# Patient Record
Sex: Female | Born: 1950 | Race: Asian | Hispanic: No | Marital: Married | Smoking: Never smoker
Health system: Southern US, Community
[De-identification: ages and names within clinical notes are randomized; demographics above are authoritative.]

## PROBLEM LIST (undated history)

## (undated) DIAGNOSIS — I10 Essential (primary) hypertension: Secondary | ICD-10-CM

## (undated) DIAGNOSIS — E119 Type 2 diabetes mellitus without complications: Secondary | ICD-10-CM

## (undated) DIAGNOSIS — N289 Disorder of kidney and ureter, unspecified: Secondary | ICD-10-CM

---

## 2005-06-09 ENCOUNTER — Inpatient Hospital Stay (HOSPITAL_COMMUNITY): Admission: AD | Admit: 2005-06-09 | Discharge: 2005-06-10 | Payer: Self-pay | Admitting: Cardiovascular Disease

## 2014-08-05 ENCOUNTER — Encounter (HOSPITAL_COMMUNITY): Payer: Self-pay | Admitting: Neurology

## 2014-08-05 ENCOUNTER — Emergency Department (HOSPITAL_COMMUNITY)
Admission: EM | Admit: 2014-08-05 | Discharge: 2014-08-05 | Disposition: A | Discharge status: Home / Self Care | Payer: BLUE CROSS/BLUE SHIELD | Attending: Emergency Medicine | Admitting: Emergency Medicine

## 2014-08-05 DIAGNOSIS — Z992 Dependence on renal dialysis: Secondary | ICD-10-CM | POA: Insufficient documentation

## 2014-08-05 DIAGNOSIS — E119 Type 2 diabetes mellitus without complications: Secondary | ICD-10-CM | POA: Diagnosis not present

## 2014-08-05 DIAGNOSIS — N189 Chronic kidney disease, unspecified: Secondary | ICD-10-CM

## 2014-08-05 DIAGNOSIS — I120 Hypertensive chronic kidney disease with stage 5 chronic kidney disease or end stage renal disease: Secondary | ICD-10-CM | POA: Diagnosis not present

## 2014-08-05 DIAGNOSIS — N186 End stage renal disease: Secondary | ICD-10-CM | POA: Diagnosis not present

## 2014-08-05 HISTORY — DX: Essential (primary) hypertension: I10

## 2014-08-05 HISTORY — DX: Type 2 diabetes mellitus without complications: E11.9

## 2014-08-05 HISTORY — DX: Disorder of kidney and ureter, unspecified: N28.9

## 2014-08-05 LAB — I-STAT CHEM 8, ED
BUN: 43 mg/dL — ABNORMAL HIGH (ref 6–23)
CREATININE: 6.2 mg/dL — AB (ref 0.50–1.10)
Calcium, Ion: 1.06 mmol/L — ABNORMAL LOW (ref 1.13–1.30)
Chloride: 94 mmol/L — ABNORMAL LOW (ref 96–112)
Glucose, Bld: 398 mg/dL — ABNORMAL HIGH (ref 70–99)
HEMATOCRIT: 39 % (ref 36.0–46.0)
HEMOGLOBIN: 13.3 g/dL (ref 12.0–15.0)
Potassium: 4.5 mmol/L (ref 3.5–5.1)
Sodium: 129 mmol/L — ABNORMAL LOW (ref 135–145)
TCO2: 19 mmol/L (ref 0–100)

## 2014-08-05 NOTE — ED Notes (Signed)
Patient placed in the room, in gown and attached to 5 Lead cardiac monitor.

## 2014-08-05 NOTE — Discharge Instructions (Signed)
°Emergency Department Resource Guide °1) Find a Doctor and Pay Out of Pocket °Although you won't have to find out who is covered by your insurance plan, it is a good idea to ask around and get recommendations. You will then need to call the office and see if the doctor you have chosen will accept you as a new patient and what types of options they offer for patients who are self-pay. Some doctors offer discounts or will set up payment plans for their patients who do not have insurance, but you will need to ask so you aren't surprised when you get to your appointment. ° °2) Contact Your Local Health Department °Not all health departments have doctors that can see patients for sick visits, but many do, so it is worth a call to see if yours does. If you don't know where your local health department is, you can check in your phone book. The CDC also has a tool to help you locate your state's health department, and many state websites also have listings of all of their local health departments. ° °3) Find a Walk-in Clinic °If your illness is not likely to be very severe or complicated, you may want to try a walk in clinic. These are popping up all over the country in pharmacies, drugstores, and shopping centers. They're usually staffed by nurse practitioners or physician assistants that have been trained to treat common illnesses and complaints. They're usually fairly quick and inexpensive. However, if you have serious medical issues or chronic medical problems, these are probably not your best option. ° °No Primary Care Doctor: °- Call [facility] at  832-8000 - they can help you locate a primary care doctor that  accepts your insurance, provides certain services, etc. °- Physician Referral Service- 1-800-533-3463 ° °Chronic Pain Problems: °Organization         Address  Phone   Notes  °[facility]  (336) 297-2271 Patients need to be referred by their primary care doctor.  ° °Medication  Assistance: °Organization         Address  Phone   Notes  °[place] [facility] [address] °[place], [place] [address] (336) 641-8030 --Must be a resident of [place] °-- Must have NO insurance coverage whatsoever (no Medicaid/ Medicare, etc.) °-- The pt. MUST have a primary care doctor that directs their care regularly and follows them in the community °  °[facility]  (866) 331-1348   °[employer]  (888) 892-1162   ° °Agencies that provide inexpensive medical care: °Organization         Address  Phone   Notes  °[facility]  (336) 832-8035   °[facility]    (336) 832-7272   °[facility] [address] °[place], [place] [address] (336) 832-4777   °[facility] [address], °[place] (336) 271-4999   °[facility]    (336) 373-0678   °[facility]    (336) 272-1050   °[facility] ° [address], [place] Phone:  (336) 832-4444, Fax:  (336) 832-4440 Hours of Operation:  9 am - 6 pm, M-F.  Also accepts Medicaid/Medicare and self-pay.  °[facility] ° [address], [place] Phone: (336) 832-3150, Fax: (336) 832-3151. Hours of Operation:  8:30 am - 5:30 pm, M-F.  Also accepts Medicaid and self-pay.  °[facility] [place] [address], [place] Phone: (336) 878-6027   °[facility] [address], [place], [place] (336)723-1848, Ext. 123 Mondays & Thursdays: 7-9 AM.  First 15 patients are seen on a first come, first serve basis. °  ° °Medicaid-accepting [place] Providers: ° °Organization         Address  Phone   Notes  °[facility] [address], [place] (336) 641-2100 Also accepts self-pay patients.  °[facility] [address], [place] ° (336) 856-9996   °[facility] [address], [place]  (336) 288-8857   °[facility] [address] [place] [address], [place] (336) 299-7000   °Veita Bland [address], [place]  ° (336) 373-1557 Only accepts [place] Access Medicaid patients after they have their name applied to their card.  ° °Self-Pay (no insurance) in [place]: ° °Organization         Address  Phone   Notes  °Sickle Cell Patients, [facility] [address], [place] (336) 832-1970   °[facility] [address], [place] (336) 832-4400   °[facility] ° [address], [place] (336) 992-4800   °[facility]/Dr. Osei-Bonsu ° [address] [place] [address], [place] or [address], [place] (336) 841-8500 Phone number for both [place] and [place] locations is the same.  °[facility] [address], [place] (336) 299-0000   °[facility] [address] [place] [address], [place] or [address] (336) 852-7530 °(336) 878-2260   °[facility] [address], [place] (336) 350-1642, phone; (336) 294-5005, fax Sees patients 1st and 3rd Saturday of every month.  Must not qualify for public or private insurance (i.e. Medicaid, Medicare, [place] Health Choice, Veterans' Benefits) • Household income should be no more than 200% of the poverty level •The clinic cannot treat you if you are pregnant or think you are pregnant • Sexually transmitted diseases are not treated at the clinic.  ° ° °Dental Care: °Organization         Address  Phone  Notes  °[place] [facility] [address], [place] (336) 641-6152 Accepts children up to age 21 who are enrolled in Medicaid or [place] Health Choice; pregnant women with a Medicaid card; and children who have applied for Medicaid or [place] Health Choice, but were declined, whose parents can pay a reduced fee at time of service.  °[place]  [facility] [place]  [address], [place] (336) 641-7733 Accepts children up to age 21 who are enrolled in Medicaid or [place] Health Choice; pregnant women with a Medicaid card; and children who have applied for Medicaid or [place] Health Choice, but were declined, whose parents can pay a reduced fee at time of service.  °[facility] ° [address], [place] (336) 641-4533 Patients are seen by appointment only. Walk-ins are not accepted. [facility] will see patients 18 years of age and older. °Monday - Tuesday (8am-5pm) °Most Wednesdays (8:30-5pm) °$30 per visit, cash only  °[facility] ° [address], [place] (336) 641-4533 Patients are seen by appointment only. Walk-ins are not accepted. [facility] will see patients 18 years of age and older. °One   Wednesday Evening (Monthly: Volunteer Based).  $30 per visit, cash only  °[facility]  (919) 537-3737 for adults; Children under age 4, call Graduate Pediatric Dentistry at (919) [phone]. Children aged 4-14, please call (919) 537-3737 to request a pediatric application. ° Dental services are provided in all areas of dental care including fillings, crowns and bridges, complete and partial dentures, implants, gum treatment, root canals, and extractions. Preventive care is also provided. Treatment is provided to both adults and children. °Patients are selected via a lottery and there is often a waiting list. °  °[facility] [address], °[place] ° (336) 763-8833 [email or web address] °  °[facility] [address], [place], [place] (336)723-1848, Ext. 123 Second and Fourth Thursday of each month, opens at 6:30 AM; Clinic ends at 9 AM.  Patients are seen on a first-come first-served basis, and a limited number are seen during each clinic.  ° °[facility] ° [address], [place], [place] (336) 723-7904    Eligibility Requirements °You must have lived in [place], [place], or [place] counties for at least the last three months. °  You cannot be eligible for state or federal sponsored healthcare insurance, including Veterans Administration, Medicaid, or Medicare. °  You generally cannot be eligible for healthcare insurance through your employer.  °  How to apply: °Eligibility screenings are held every Tuesday and Wednesday afternoon from 1:00 pm until 4:00 pm. You do not need an appointment for the interview!  °[facility] [address], [place], [place] 336-631-2330   °[facility]  336-342-8273   °[facility]  336-703-3100   °[facility]  336-570-6415   ° °Behavioral Health Resources in the Community: °Intensive Outpatient Programs °Organization         Address  Phone  Notes  °[facility] [address], [place], [place] 336-878-6098   °[facility] [address], [place], [place] 336-832-9800   °[facility] [address], [place], [place] ° 336-882-2125   °[facility] [address],  °[place], [place] 1-800-853-5163 or 336-641-4981   °Substance Abuse Resources °Organization         Address  Phone  Notes  °[facility]  336-882-2125   °[facility]  336-784-9470   °[facility]  336-285-9073   °[facility]  336-845-3988   °Residential & Outpatient Substance Abuse Program  1-800-659-3381   °Psychological Services °Organization         Address  Phone  Notes  °[facility]  336- 832-9600   °[facility]  336- 378-7881   °[facility] [address], [place] 1-800-853-5163 or 336-641-4981   ° °Mobile Crisis Teams °Organization         Address  Phone  Notes  °[facility], Mobile Crisis Care Unit  1-877-626-1772   °Assertive °[facility] ° [address]  [place], [place] 336-834-9664   °Sharon DeEsch [address] °[place] 336-554-5454   ° °Self-Help/Support Groups °Organization         Address  Phone             Notes  °[facility] - variety of support groups  336- 373-1402 Call for more information  °Narcotics Anonymous (NA), [facility] [address], °[place]  2 meetings at this location  ° °  Residential Treatment Programs °Organization         Address  Phone  Notes  °[facility] [address],    °[place]  1-866-801-8205   °[facility] ° [address], [place], [place] 704-293-8524   °[facility] [address], [place] 336-845-3988 Admissions: 8am-3pm M-F  °[facility] [address]    °[place], [place] 336-841-1104   °[facility] [address], [place], [place] 336-379-7146   °[facility] [address]  °[place], [place] 336-285-9073   °[facility] - Intensive Outpatient [address], [place], [place] 336-852-3033   °[facility] [address]  °[place], [place] 1-877-615-2722 or 336-784-9470   °[facility] [address] [place], [place] 336-227-7417 Accepts Medicaid  °[facility] [address]  °[place] 1-800-659-3381 Substance Abuse/Addiction Treatment  ° °[place] Behavioral Health Resources °Organization         Address  Phone  Notes  °[facility]  (888) 581-9988   °Julie Brannon, PhD [address] [place], [place]   (336) 349-5553 or (336) 951-0000   °[facility]   [address] °[place], [place] (336) 349-4454   °[facility] [address], [place], [place] (336) 342-8316 Insurance/Medicaid/sponsorship through [facility]  °[facility] [address]                                    [place], [place] (336) 342-8316 Therapy/tele-psych/case    °[facility] [address]  ° [place], [place] (336) 349-2233    °Dr. Arfeen  (336) 349-4544   °[facility] [place]  [employer] [place] [facility] 1) [address], [place] °2) [address], [place] °3)  [address], [place] (336) 349-3220 °(336) 342-7768 ° °(336) 342-8140   °[place] [facility] (336) 342-1394 or (336) 342-3537 (After Hours)    ° ° °

## 2014-08-05 NOTE — ED Notes (Signed)
Dialysis on Monday, Wednesday and Fridays.  Patient moved her and has not been dialysised since Friday.

## 2014-08-05 NOTE — ED Provider Notes (Signed)
CSN: [id number]     Arrival date & time 08/05/14  1154 History   First MD Initiated Contact with Patient 08/05/14 1225     Chief Complaint  Patient presents with  . Vascular Access Problem     The history is provided by the patient and a relative.  Patient presents for dialysis. She goes to dialysis Monday/wednesday/friday.  She last had dialysis 3 days ago.  She denies cp/sob.  No fever is reported.  She has had mild cough due to allergies  [place] and has not had dialysis sessions arranged as of yet.  She is with her daughter in law Her course is stable.  Nothing worsens her symptoms.   Past Medical History  Diagnosis Date  . Diabetes mellitus without complication   . Renal disorder   . Hypertension    History reviewed. No pertinent past surgical history. No family history on file. History  Substance Use Topics  . Smoking status: Never Smoker   . Smokeless tobacco: Not on file  . Alcohol Use: No   OB History    No data available     Review of Systems  Constitutional: Negative for fever.  Respiratory: Negative for shortness of breath.       Allergies  Review of patient's allergies indicates no known allergies.  Home Medications   Prior to Admission medications   Not on File   BP 176/65 mmHg  Pulse 77  Temp(Src) 99 F (37.2 C) (Oral)  Resp 25  SpO2 99% Physical Exam CONSTITUTIONAL: Well developed/well nourished HEAD: Normocephalic/atraumatic EYES: EOMI NECK: supple no meningeal signs SPINE/BACK:entire spine nontender CV: S1/S2 noted, no murmurs/rubs/gallops noted LUNGS: scattered coarse BS, but no crackles noted ABDOMEN: soft, nontender, no rebound or guarding, bowel sounds noted throughout abdomen NEURO: Pt is awake/alert/appropriate, moves all extremitiesx4.  No facial droop.   EXTREMITIES: pulses normal/equal, full ROM, dialysis access to left UE with thrill noted. SKIN: warm, color normal PSYCH: no abnormalities of mood  noted, alert and oriented to situation  ED Course  Procedures   12:58 PM Consulted nephrology dr Kathrene [place] Will arrange outpatient dialysis Pt appropriate for d/c home  Labs Review Labs Reviewed  I-STAT CHEM 8, ED - Abnormal; Notable for the following:    Sodium 129 (*)    Chloride 94 (*)    BUN 43 (*)    Creatinine, Ser 6.20 (*)    Glucose, Bld 398 (*)    Calcium, Ion 1.06 (*)    All other components within normal limits     MDM   Final diagnoses:  Chronic renal failure, unspecified stage    Nursing notes including past medical history and social history reviewed and considered in documentation Labs/vital reviewed myself and considered during evaluation     Zadie Rhineonald Jerald Villalona, MD 08/05/14 1336

## 2014-08-05 NOTE — ED Notes (Signed)
Pt here requesting dialysis. [place]. Needs dialysis, was last dialyzed on Friday. Denies complaints. Is here with daughter in law.  No pain.

## 2014-08-12 ENCOUNTER — Other Ambulatory Visit: Payer: Self-pay

## 2014-08-16 ENCOUNTER — Other Ambulatory Visit: Payer: Self-pay | Admitting: Critical Care Medicine

## 2014-08-16 ENCOUNTER — Ambulatory Visit
Admission: RE | Admit: 2014-08-16 | Discharge: 2014-08-16 | Disposition: A | Discharge status: Home / Self Care | Payer: BLUE CROSS/BLUE SHIELD | Source: Ambulatory Visit | Attending: Critical Care Medicine | Admitting: Critical Care Medicine

## 2014-08-16 DIAGNOSIS — Z111 Encounter for screening for respiratory tuberculosis: Secondary | ICD-10-CM

## 2014-09-04 ENCOUNTER — Other Ambulatory Visit: Payer: Self-pay | Admitting: Nephrology

## 2014-09-04 ENCOUNTER — Ambulatory Visit
Admission: RE | Admit: 2014-09-04 | Discharge: 2014-09-04 | Disposition: A | Discharge status: Home / Self Care | Payer: BLUE CROSS/BLUE SHIELD | Source: Ambulatory Visit | Attending: Nephrology | Admitting: Nephrology

## 2014-09-04 DIAGNOSIS — R05 Cough: Secondary | ICD-10-CM

## 2014-09-04 DIAGNOSIS — R053 Chronic cough: Secondary | ICD-10-CM

## 2014-12-13 ENCOUNTER — Encounter: Payer: Self-pay | Admitting: Internal Medicine

## 2014-12-13 ENCOUNTER — Ambulatory Visit (INDEPENDENT_AMBULATORY_CARE_PROVIDER_SITE_OTHER): Payer: Medicare Other | Admitting: Internal Medicine

## 2014-12-13 ENCOUNTER — Other Ambulatory Visit (INDEPENDENT_AMBULATORY_CARE_PROVIDER_SITE_OTHER): Payer: Medicare Other | Admitting: *Deleted

## 2014-12-13 VITALS — BP 112/62 | HR 71 | Temp 98.3°F | Resp 12 | Ht 63.0 in | Wt 133.0 lb

## 2014-12-13 DIAGNOSIS — N186 End stage renal disease: Secondary | ICD-10-CM | POA: Diagnosis not present

## 2014-12-13 DIAGNOSIS — E1122 Type 2 diabetes mellitus with diabetic chronic kidney disease: Secondary | ICD-10-CM

## 2014-12-13 DIAGNOSIS — IMO0002 Reserved for concepts with insufficient information to code with codable children: Secondary | ICD-10-CM

## 2014-12-13 DIAGNOSIS — E1165 Type 2 diabetes mellitus with hyperglycemia: Principal | ICD-10-CM

## 2014-12-13 LAB — POCT GLYCOSYLATED HEMOGLOBIN (HGB A1C): Hemoglobin A1C: 13

## 2014-12-13 MED ORDER — INSULIN PEN NEEDLE 32G X 4 MM MISC: Status: AC

## 2014-12-13 MED ORDER — INSULIN GLARGINE 100 UNIT/ML SOLOSTAR PEN: 10.0000 [IU] | PEN_INJECTOR | Freq: Every day | SUBCUTANEOUS | Status: DC

## 2014-12-13 MED ORDER — GLIPIZIDE 10 MG PO TABS: 10.0000 mg | ORAL_TABLET | Freq: Two times a day (BID) | ORAL | Status: DC

## 2014-12-13 NOTE — Progress Notes (Signed)
Patient ID: Yolanda Rush, female   DOB: 09/28/1950, 64 y.o.   MRN: [id number]  HPI: Yolanda Rush is a 64 y.o.-year-old female, referred by her nephrologist, Dr.Deterding, for management of DM2, dx in 1983, uncontrolled, with complications (ESRD). She is here with her husband who offers most history and translating for [place]. She has M'care and M'aid.   Last hemoglobin A1c was: 08/22/2014: HbA1c 11.5%  Pt is on a regimen of: - Glipizide 10 mg bid - Onglyza 5 mg daily  Pt does not check her sugars (no meter), before, she was checking 1x a day. Reviewed log from dialysis center for the last 1.5 months (will scan) - am: 150-200 - 2h after b'fast: n/c - before lunch: n/c - 2h after lunch: n/c - before dinner: n/c - 2h after dinner: n/c - bedtime: n/c - nighttime: n/c No lows. Lowest sugar was 150s; she has hypoglycemia awareness at 70.  Highest sugar was 500s.  Glucometer: none  Pt's meals are - vegetarian: - Breakfast: cereals + milk - Lunch: [place] food - Dinner: [place] food - Snacks: once   - Has ESRD - on HD TTS, last BUN/creatinine:  Lab Results  Component Value Date   BUN 43* 08/05/2014   CREATININE 6.20* 08/05/2014   - last set of lipids: No results found for: CHOL, HDL, LDLCALC, LDLDIRECT, TRIG, CHOLHDL - last eye exam was in 2014 ([place]). ? If DR.  - no numbness and tingling in her feet.  Pt has FH of DM in mother.  ROS: Constitutional: no weight gain/loss, no fatigue, no subjective hyperthermia/hypothermia Eyes: no blurry vision, no xerophthalmia ENT: no sore throat, no nodules palpated in throat, no dysphagia/odynophagia, no hoarseness, + decreased hearing Cardiovascular: + occas.CP/no SOB/palpitations/leg swelling Respiratory: + cough - chronic/no SOB Gastrointestinal: no N/V/D/C Musculoskeletal: no muscle/+ joint aches Skin: no rashes, + easy bruising Neurological: no tremors/numbness/tingling/dizziness, + HAs Psychiatric: no depression/anxiety  Past  Medical History  Diagnosis Date  . Diabetes mellitus without complication   . Renal disorder   . Hypertension    No past surgical history on file.   History   Social History  . Marital Status: Married    Spouse Name: N/A  . Number of Children: 1   Occupational History  . retired   Social History Main Topics  . Smoking status: Never Smoker   . Smokeless tobacco: Not on file  . Alcohol Use: No  . Drug Use: Not on file   Current Outpatient Rx  Name  Route  Sig  Dispense  Refill  . Acetaminophen 500 MG TBDP   Oral   Take 1 tablet by mouth as needed. Equate Brand         . calcium acetate (PHOSLO) 667 MG capsule               . DiphenhydrAMINE HCl (ALLERGY MED PO)   Oral   Take 10 mg by mouth as needed. Equate Brand         . glipiZIDE (GLUCOTROL) 5 MG tablet               . metoprolol tartrate (LOPRESSOR) 25 MG tablet               . multivitamin (RENA-VIT) TABS tablet   Oral   Take 1 tablet by mouth daily.         . saxagliptin HCl (ONGLYZA) 5 MG TABS tablet   Oral   Take 5 mg by mouth daily.  No Known Allergies   FH: - noncontributory  PE: BP 112/62 mmHg  Pulse 71  Temp(Src) 98.3 F (36.8 C) (Oral)  Resp 12  Ht 5\' 3"  (1.6 m)  Wt 133 lb (60.328 kg)  BMI 23.57 kg/m2  SpO2 99% Wt Readings from Last 3 Encounters:  12/13/14 133 lb (60.328 kg)   Constitutional: overweight, in NAD, in wheelchair Eyes: PERRLA, EOMI, no exophthalmos ENT: moist mucous membranes, no thyromegaly, no cervical lymphadenopathy Cardiovascular: RRR, No MRG Respiratory: CTA B Gastrointestinal: abdomen soft, NT, ND, BS+ Musculoskeletal: no deformities, strength intact in all 4 Skin: moist, warm, no rashes, onycodystrophy L big toe Neurological: no tremor with outstretched hands, DTR normal in all 4  ASSESSMENT: 1. DM2, insulin-dependent, uncontrolled, with complications - ESRD  PLAN:  1. Patient with long-standing, uncontrolled diabetes, on oral  antidiabetic regimen, which became insufficient. Sugars widely fluctuating per records from nephrology - pt does not bring sugars, dose not check at home (no meter). Given meter, refilled strips. - will need insulin >> discussed basal insulin addition. Trained pt Re: correct adm. of insulin inj. - will start at low dose: 10 units hs >> may need more and may also need a different regimen in HD vs non-HD days; may also need mealtime insulin at next visit. For now, continue current po regimen. - I suggested to:  Patient Instructions  Please continue: - Glipizide 10 mg 2x a day, before meals - Onglyza 5 m daily in am  Please add: - Lantus 10 units at bedtime. If sugars in am not <150 in the next 3 days, please increase the dose to 12 units. If sugars in am not <150 in the next 3 days, please call us.  When injecting insulin:  Inject in the abdomen  Rotate the injection sites around the belly button  Change needle for each injection  Keep needle in for 10 sec after last unit of insulin in  Keep the insulin in use out of the fridge  Please return in 1 month with your sugar log.   - Strongly advised her to start checking sugars at different times of the day - check 3 times a day, rotating checks - given sugar log and advised how to fill it and to bring it at next appt  - given foot care handout and explained the principles  - given instructions for hypoglycemia management "15-15 rule"  - advised for yearly eye exams - check HbA1c today >> 13% (higher!) - Return to clinic in 1 mo with sugar log   - time spent with the patient: 1 hour, of which >50% was spent in obtaining information about her diabetes, reviewing her previous labs, evaluations, and treatments, counseling her and her husband about her condition (please see the discussed topics above), and developing a plan to further treat it.

## 2014-12-13 NOTE — Patient Instructions (Addendum)
Please continue: - Glipizide 10 mg 2x a day, before meals - Onglyza 5 m daily in am  Please add: - Lantus 10 units at bedtime. If sugars in am not <150 in the next 3 days, please increase the dose to 12 units. If sugars in am not <150 in the next 3 days, please call us.  When injecting insulin:  Inject in the abdomen  Rotate the injection sites around the belly button  Change needle for each injection  Keep needle in for 10 sec after last unit of insulin in  Keep the insulin in use out of the fridge  Please return in 1 month with your sugar log.   PATIENT INSTRUCTIONS FOR TYPE 2 DIABETES:  **Please join MyChart!** - see attached instructions about how to join if you have not done so already.  DIET AND EXERCISE Diet and exercise is an important part of diabetic treatment.  We recommended aerobic exercise in the form of brisk walking (working between 40-60% of maximal aerobic capacity, similar to brisk walking) for 150 minutes per week (such as 30 minutes five days per week) along with 3 times per week performing 'resistance' training (using various gauge rubber tubes with handles) 5-10 exercises involving the major muscle groups (upper body, lower body and core) performing 10-15 repetitions (or near fatigue) each exercise. Start at half the above goal but build slowly to reach the above goals. If limited by weight, joint pain, or disability, we recommend daily walking in a swimming pool with water up to waist to reduce pressure from joints while allow for adequate exercise.    BLOOD GLUCOSES Monitoring your blood glucoses is important for continued management of your diabetes. Please check your blood glucoses 2-4 times a day: fasting, before meals and at bedtime (you can rotate these measurements - e.g. one day check before the 3 meals, the next day check before 2 of the meals and before bedtime, etc.).   HYPOGLYCEMIA (low blood sugar) Hypoglycemia is usually a reaction to not  eating, exercising, or taking too much insulin/ other diabetes drugs.  Symptoms include tremors, sweating, hunger, confusion, headache, etc. Treat IMMEDIATELY with 15 grams of Carbs: . 4 glucose tablets .  cup regular juice/soda . 2 tablespoons raisins . 4 teaspoons sugar . 1 tablespoon honey Recheck blood glucose in 15 mins and repeat above if still symptomatic/blood glucose <100.  RECOMMENDATIONS TO REDUCE YOUR RISK OF DIABETIC COMPLICATIONS: * Take your prescribed MEDICATION(S) * Follow a DIABETIC diet: Complex carbs, fiber rich foods, (monounsaturated and polyunsaturated) fats * AVOID saturated/trans fats, high fat foods, >2,300 mg salt per day. * EXERCISE at least 5 times a week for 30 minutes or preferably daily.  * DO NOT SMOKE OR DRINK more than 1 drink a day. * Check your FEET every day. Do not wear tightfitting shoes. Contact us if you develop an ulcer * See your EYE doctor once a year or more if needed * Get a FLU shot once a year * Get a PNEUMONIA vaccine once before and once after age 59 years  GOALS:  * Your Hemoglobin A1c of <7%  * fasting sugars need to be <130 * after meals sugars need to be <180 (2h after you start eating) * Your Systolic BP should be 140 or lower  * Your Diastolic BP should be 80 or lower  * Your HDL (Good Cholesterol) should be 40 or higher  * Your LDL (Bad Cholesterol) should be 100 or lower. * Your Triglycerides  should be 150 or lower  * Your Urine microalbumin (kidney function) should be <30 * Your Body Mass Index should be 25 or lower

## 2014-12-19 ENCOUNTER — Telehealth: Payer: Self-pay | Admitting: Internal Medicine

## 2014-12-19 NOTE — Telephone Encounter (Signed)
Patient need refill of test strips don't know what kind of meter.

## 2014-12-23 ENCOUNTER — Telehealth: Payer: Self-pay | Admitting: Internal Medicine

## 2014-12-23 MED ORDER — ONETOUCH DELICA LANCETS FINE MISC: Status: AC

## 2014-12-23 MED ORDER — GLUCOSE BLOOD VI STRP: ORAL_STRIP | Status: AC

## 2014-12-23 NOTE — Telephone Encounter (Signed)
Pharmacist calling for new rx for the one touch ultra test strips and lancets please call them into [facility] pharm # [phone]  For the rx for test strips the ICD 10 code must be on it please and cannot exceed for than 5 refills

## 2014-12-30 ENCOUNTER — Other Ambulatory Visit: Payer: Self-pay | Admitting: *Deleted

## 2014-12-30 DIAGNOSIS — Z01810 Encounter for preprocedural cardiovascular examination: Secondary | ICD-10-CM

## 2014-12-30 DIAGNOSIS — N186 End stage renal disease: Secondary | ICD-10-CM

## 2015-01-07 ENCOUNTER — Encounter: Payer: Self-pay | Admitting: Vascular Surgery

## 2015-01-08 ENCOUNTER — Encounter: Payer: Self-pay | Admitting: Vascular Surgery

## 2015-01-08 ENCOUNTER — Ambulatory Visit (HOSPITAL_COMMUNITY)
Admission: RE | Admit: 2015-01-08 | Discharge: 2015-01-08 | Disposition: A | Discharge status: Home / Self Care | Payer: Medicare Other | Source: Ambulatory Visit | Attending: Vascular Surgery | Admitting: Vascular Surgery

## 2015-01-08 ENCOUNTER — Ambulatory Visit (INDEPENDENT_AMBULATORY_CARE_PROVIDER_SITE_OTHER): Payer: Medicare Other | Admitting: Vascular Surgery

## 2015-01-08 ENCOUNTER — Ambulatory Visit (INDEPENDENT_AMBULATORY_CARE_PROVIDER_SITE_OTHER)
Admission: RE | Admit: 2015-01-08 | Discharge: 2015-01-08 | Disposition: A | Discharge status: Home / Self Care | Payer: Medicare Other | Source: Ambulatory Visit | Attending: Vascular Surgery | Admitting: Vascular Surgery

## 2015-01-08 VITALS — BP 150/77 | HR 71 | Temp 98.0°F | Resp 16 | Ht 62.0 in | Wt 130.1 lb

## 2015-01-08 DIAGNOSIS — N186 End stage renal disease: Secondary | ICD-10-CM | POA: Insufficient documentation

## 2015-01-08 DIAGNOSIS — Z01810 Encounter for preprocedural cardiovascular examination: Secondary | ICD-10-CM | POA: Diagnosis present

## 2015-01-08 DIAGNOSIS — Z992 Dependence on renal dialysis: Secondary | ICD-10-CM | POA: Diagnosis not present

## 2015-01-08 NOTE — Progress Notes (Signed)
Referred by:  Zigmund Gottron, MD [address] [place], [place] [address]  Reason for referral: New access  History of Present Illness  Yolanda Rush is a 64 y.o. ([birth year]) female who presents for evaluation for permanent access.  The patient is right hand dominant.  Patient left upper arm access was created in [place] and lasted ~1.5 years.  She required multiple procedures over that time period.  She currently does HD via a LIJV TDC.  She is consider stopping HD.   Past Medical History  Diagnosis Date  . Diabetes mellitus without complication   . Renal disorder   . Hypertension     Past surgical history: R and L IJV TDC, LUA AVG   Social History   Social History  . Marital Status: Married    Spouse Name: N/A  . Number of Children: N/A  . Years of Education: N/A   Occupational History  . Not on file.   Social History Main Topics  . Smoking status: Never Smoker   . Smokeless tobacco: Never Used  . Alcohol Use: No  . Drug Use: No  . Sexual Activity: Not on file   Other Topics Concern  . Not on file   Social History Narrative    Family History  Problem Relation Age of Onset  . Diabetes Mother   . Hyperlipidemia Father     Current Outpatient Prescriptions on File Prior to Visit  Medication Sig Dispense Refill  . Acetaminophen 500 MG TBDP Take 1 tablet by mouth as needed. Equate Brand    . calcium acetate (PHOSLO) 667 MG capsule     . glipiZIDE (GLUCOTROL) 10 MG tablet Take 1 tablet (10 mg total) by mouth 2 (two) times daily before a meal. 60 tablet 3  . glucose blood (ONE TOUCH ULTRA TEST) test strip Use to test blood sugar 3 times daily as instructed. Dx: E11.22 100 each 3  . Insulin Glargine (LANTUS SOLOSTAR) 100 UNIT/ML Solostar Pen Inject 10 Units into the skin daily at 10 pm. 5 pen 2  . Insulin Pen Needle (CAREFINE PEN NEEDLES) 32G X 4 MM MISC Use 1x a dau 100 each 2  . metoprolol tartrate (LOPRESSOR) 25 MG tablet     . ONETOUCH DELICA  LANCETS FINE MISC Use to test blood sugar 3 times daily. Dx:E11.22 100 each 3  . saxagliptin HCl (ONGLYZA) 5 MG TABS tablet Take 5 mg by mouth daily.    . DiphenhydrAMINE HCl (ALLERGY MED PO) Take 10 mg by mouth as needed. Equate Brand    . multivitamin (RENA-VIT) TABS tablet Take 1 tablet by mouth daily.     No current facility-administered medications on file prior to visit.    No Known Allergies   REVIEW OF SYSTEMS:  (Positives checked otherwise negative)  CARDIOVASCULAR:  []  chest pain, []  chest pressure, []  palpitations, []  shortness of breath when laying flat, []  shortness of breath with exertion,  []  pain in feet when walking, []  pain in feet when laying flat, []  history of blood clot in veins (DVT), []  history of phlebitis, []  swelling in legs, []  varicose veins  PULMONARY:  []  productive cough, []  asthma, []  wheezing  NEUROLOGIC:  []  weakness in arms or legs, []  numbness in arms or legs, []  difficulty speaking or slurred speech, []  temporary loss of vision in one eye, []  dizziness  HEMATOLOGIC:  []  bleeding problems, []  problems with blood clotting too easily  MUSCULOSKEL:  []  joint pain, []  joint  swelling  GASTROINTEST:   vomiting blood,  blood in stool     GENITOURINARY:   burning with urination,  blood in urine  PSYCHIATRIC:   history of major depression  INTEGUMENTARY:   rashes,  ulcers  CONSTITUTIONAL:   fever,  chills  Physical Examination  Filed Vitals:   01/08/15 1028 01/08/15 1030  BP: 147/78 150/77  Pulse: 71 71  Temp: 98 F (36.7 C)   Resp: 16   Height:  (1.575 m)   Weight: 130 lb 1.1 oz (59 kg)   SpO2: 100%    Body mass index is 23.78 kg/(m^2).  General: A&O x 3, WD, thin  Head: [place]/AT, Temporalis wasting,   Ear/Nose/Throat: Hearing grossly intact, nares w/o erythema or drainage, oropharynx w/o Erythema/Exudate, Mallampati score: 3  Eyes: PERRLA, EOMI  Neck: Supple, no nuchal rigidity, no palpable LAD  Pulmonary: Sym  exp, good air movt, CTAB, no rales, rhonchi, & wheezing  Cardiac: RRR, Nl S1, S2, no Murmurs, rubs or gallops   Vascular: Vessel Right Left  Radial Palpable Palpable  Ulnar Faintly Palpable Faintly Palpable  Brachial Palpable Palpable   Musculoskeletal: M/S 5/5 throughout , Extremities without ischemic changes , LUA AVG thrombosed   Neurologic: Pain and light touch intact in arms, Motor exam as listed above   Non-Invasive Vascular Imaging  R Vein Mapping  (Date: 01/08/2015):   R arm: acceptable vein conduits include none  RUE Doppler (Date: 01/08/2015):   R arm:   Brachial: 3 mm, tri   Outside Studies/Documentation 2 pages of outside documents were reviewed including: outpatient nephrology procedure note .  Medical Decision Making  [name] is a 64 y.o. female who presents with ESRD requiring hemodialysis.    Patient is considering discontinuing HD.  Family is refusing any further access in this patient, and patient is in agreement.  Based on vein mapping and examination, this patient's permanent access options include: possible RUA AVG  Available as needed.  Obviously, if patient decides to discontinue HD, no further access will be needed.  Leonides Sake, MD [facility] Office: [phone] Pager: [phone]  01/08/2015, 10:56 AM

## 2015-01-09 ENCOUNTER — Encounter: Payer: Self-pay | Admitting: Nephrology

## 2015-02-03 ENCOUNTER — Encounter: Payer: Self-pay | Admitting: Internal Medicine

## 2015-02-03 ENCOUNTER — Ambulatory Visit (INDEPENDENT_AMBULATORY_CARE_PROVIDER_SITE_OTHER): Payer: Medicare Other | Admitting: Internal Medicine

## 2015-02-03 VITALS — BP 116/60 | HR 67 | Temp 97.9°F | Resp 12 | Wt 138.0 lb

## 2015-02-03 DIAGNOSIS — IMO0002 Reserved for concepts with insufficient information to code with codable children: Secondary | ICD-10-CM

## 2015-02-03 DIAGNOSIS — N186 End stage renal disease: Secondary | ICD-10-CM

## 2015-02-03 DIAGNOSIS — E1165 Type 2 diabetes mellitus with hyperglycemia: Principal | ICD-10-CM

## 2015-02-03 DIAGNOSIS — E1122 Type 2 diabetes mellitus with diabetic chronic kidney disease: Secondary | ICD-10-CM

## 2015-02-03 MED ORDER — "INSULIN SYRINGE-NEEDLE U-100 30G X 1/2"" 0.5 ML MISC": Status: AC

## 2015-02-03 MED ORDER — INSULIN GLARGINE 100 UNIT/ML SOLOSTAR PEN: 12.0000 [IU] | PEN_INJECTOR | Freq: Every day | SUBCUTANEOUS | Status: AC

## 2015-02-03 MED ORDER — INSULIN REGULAR HUMAN 100 UNIT/ML IJ SOLN: 4.0000 [IU] | Freq: Three times a day (TID) | INTRAMUSCULAR | Status: AC

## 2015-02-03 NOTE — Patient Instructions (Signed)
Continue Onglyza 5 mg in am. Please stop Glipizide.  Please decrease Lantus to 12 units at bedtime.  Start Regular insulin - 15 min before a meal: - 4 units before a smaller meal - 5 units before a larger meal  Please call and schedule an eye appt with Dr. Randon Goldsmith: [facility]:  Dr. Jeni Salles MD ?  Address: [address] [place], [place], [place] [address]  [phone]

## 2015-02-03 NOTE — Progress Notes (Signed)
Patient ID: Yolanda Rush, female   DOB: [birth year], 63 y.o.   MRN: [id number]  HPI: Yolanda Rush is a 64 y.o.-year-old female, initially referred by her nephrologist, Dr.Deterding, now returning for follow-up for DM2, dx in 1983, uncontrolled, with complications (ESRD). She is here with her husband who offers most history and translating for [place]. She has M'care and M'aid.   Reviewing her chart, she had an appointment with vascular surgery for permanent vascular access 1 month ago, at which she mentioned that she was considering stopping dialysis. Husbands denies this today.   Last hemoglobin A1c was: Lab Results  Component Value Date   HGBA1C 13.0 12/13/2014  08/22/2014: HbA1c 11.5%  Pt is on a regimen of: - Glipizide 10 mg bid - Onglyza 5 mg daily - Lantus 12-15 units at bedtime - started 12/2014  Pt is checking sugars once a day: - am: 77-120 - 2h after b'fast: 200-300s - before lunch:  - 2h after lunch: n/c - before dinner: n/c - 2h after dinner: 200-300 - bedtime: n/c - nighttime: n/c No lows. Lowest sugar was 150s >> 77; she has hypoglycemia awareness at 70.  Highest sugar was 500s >> 320  Glucometer: One Touch Verio flex  Pt's meals are - vegetarian - husband mentions she eats every hour, smaller meals, not larger meals. - Breakfast: cereals + milk - Lunch: [place] food - Dinner: [place] food - Snacks: once   - Has ESRD - on HD TTS. - last set of lipids: No results found for: CHOL, HDL, LDLCALC, LDLDIRECT, TRIG, CHOLHDL - last eye exam was in 2014 ([place]). ? If DR.  - no numbness and tingling in her feet.   ROS: Constitutional: no weight gain/loss, no fatigue, no subjective hyperthermia/hypothermia Eyes: no blurry vision, no xerophthalmia ENT: no sore throat, no nodules palpated in throat, no dysphagia/odynophagia, no hoarseness, + decreased hearing Cardiovascular: + occas.CP/no SOB/palpitations/leg swelling Respiratory: + cough - chronic/no  SOB Gastrointestinal: no N/V/D/C Musculoskeletal: no muscle/+ joint aches Skin: no rashes, + easy bruising Neurological: no tremors/numbness/tingling/dizziness, + HAs  I reviewed pt's medications, allergies, PMH, social hx, family hx, and changes were documented in the history of present illness. Otherwise, unchanged from my initial visit note.  Past Medical History  Diagnosis Date  . Diabetes mellitus without complication   . Renal disorder   . Hypertension    No past surgical history on file.   History   Social History  . Marital Status: Married    Spouse Name: N/A  . Number of Children: 1   Occupational History  . retired   Social History Main Topics  . Smoking status: Never Smoker   . Smokeless tobacco: Not on file  . Alcohol Use: No  . Drug Use: Not on file   Current Outpatient Rx  Name  Route  Sig  Dispense  Refill  . Acetaminophen 500 MG TBDP   Oral   Take 1 tablet by mouth as needed. Equate Brand         . calcium acetate (PHOSLO) 667 MG capsule               . DiphenhydrAMINE HCl (ALLERGY MED PO)   Oral   Take 10 mg by mouth as needed. Equate Brand         . glipiZIDE (GLUCOTROL) 5 MG tablet               . metoprolol tartrate (LOPRESSOR) 25 MG tablet               .  multivitamin (RENA-VIT) TABS tablet   Oral   Take 1 tablet by mouth daily.         . saxagliptin HCl (ONGLYZA) 5 MG TABS tablet   Oral   Take 5 mg by mouth daily.         No Known Allergies   FH: - noncontributory  PE: BP 116/60 mmHg  Pulse 67  Temp(Src) 97.9 F (36.6 C) (Oral)  Resp 12  Wt 38 lb 12.8 oz (17.6 kg)  SpO2 97%  Wt Readings from Last 3 Encounters:  02/03/15 38 lb 12.8 oz (17.6 kg)  01/08/15 130 lb 1.1 oz (59 kg)  12/13/14 133 lb (60.328 kg)   Constitutional: overweight, in NAD, in wheelchair Eyes: PERRLA, EOMI, no exophthalmos ENT: moist mucous membranes, no thyromegaly, no cervical lymphadenopathy Cardiovascular: RRR, No  MRG Respiratory: CTA B Gastrointestinal: abdomen soft, NT, ND, BS+ Musculoskeletal: no deformities, strength intact in all 4 Skin: moist, warm, no rashes, onycodystrophy L big toe Neurological: no tremor with outstretched hands, DTR normal in all 4  ASSESSMENT: 1. DM2, insulin-dependent, uncontrolled, with complications - ESRD  PLAN:  1. Patient with long-standing, uncontrolled diabetes, on oral antidiabetic regimen + basal insulin added at last visit. Sugars still very high throughout the day as she eats all the time per husband report. I suggested Regular insulin before main meals as this is longer acting c/w insulin analogs and covers her grazing. Will advise her to take the 15 min before a meal, rather than 30 min for this reason. - At last visit, we check her hemoglobin A1c and this is really high, at 13% - reviewed with the patient today. - Advised them to:  Patient Instructions  Continue Onglyza 5 mg in am. Please stop Glipizide.  Please decrease Lantus to 12 units at bedtime.  Start Regular insulin - 15 min before a meal: - 4 units before a smaller meal - 5 units before a larger meal  Please call and schedule an eye appt with Dr. Randon Goldsmith: [facility]:  Dr. Jeni Salles MD ?  Address: [address] [place], [place], [place] [address]  [phone]  - continue checking sugars at different times of the day - check 3 times a day, rotating checks - advised for yearly eye exams >> given Dr Randon Goldsmith tel nr. - Return to clinic in 1.5 mo with sugar log

## 2015-02-25 ENCOUNTER — Encounter: Payer: Self-pay | Admitting: Vascular Surgery

## 2015-02-28 ENCOUNTER — Ambulatory Visit: Payer: Medicare Other | Admitting: Vascular Surgery

## 2015-02-28 ENCOUNTER — Encounter: Payer: Self-pay | Admitting: Vascular Surgery

## 2015-02-28 ENCOUNTER — Encounter: Payer: Self-pay | Admitting: *Deleted

## 2015-02-28 ENCOUNTER — Other Ambulatory Visit: Payer: Self-pay | Admitting: *Deleted

## 2015-02-28 VITALS — BP 117/74 | HR 57 | Temp 98.3°F | Resp 14 | Ht 62.0 in | Wt 136.0 lb

## 2015-02-28 DIAGNOSIS — N186 End stage renal disease: Secondary | ICD-10-CM

## 2015-02-28 DIAGNOSIS — Z992 Dependence on renal dialysis: Principal | ICD-10-CM

## 2015-02-28 NOTE — Progress Notes (Signed)
Established Dialysis Access  History of Present Illness  Yolanda Rush is a 64 y.o. ([birth year]) female who presents for re-evaluation for permanent access.  The patient is right hand dominant.  Previous access procedures have been completed in the left arm.  The patient's complication from previous access procedures include: thrombosis.  The patient has never had a previous PPM placed.  Previously the patient was going to stop HD.  She has changed her mind so would like access placement.  The patient's PMH, PSH, SH, and FamHx are unchanged from 01/08/15.  Current Outpatient Prescriptions  Medication Sig Dispense Refill  . Acetaminophen 500 MG TBDP Take 1 tablet by mouth as needed. Equate Brand    . calcium acetate (PHOSLO) 667 MG capsule     . DiphenhydrAMINE HCl (ALLERGY MED PO) Take 10 mg by mouth as needed. Equate Brand    . glucose blood (ONE TOUCH ULTRA TEST) test strip Use to test blood sugar 3 times daily as instructed. Dx: E11.22 100 each 3  . HYDROcodone-acetaminophen (NORCO/VICODIN) 5-325 MG per tablet Take by mouth.    . lanthanum (FOSRENOL) 1000 MG chewable tablet Chew 1,000 mg by mouth 3 (three) times daily with meals.    . metoprolol tartrate (LOPRESSOR) 25 MG tablet     . multivitamin (RENA-VIT) TABS tablet Take 1 tablet by mouth daily.    Letta Pate. ONETOUCH DELICA LANCETS FINE MISC Use to test blood sugar 3 times daily. Dx:E11.22 100 each 3  . saxagliptin HCl (ONGLYZA) 5 MG TABS tablet Take 5 mg by mouth daily.    . Insulin Glargine (LANTUS SOLOSTAR) 100 UNIT/ML Solostar Pen Inject 12 Units into the skin daily at 10 pm. (Patient not taking: Reported on 02/28/2015) 5 pen 2  . Insulin Pen Needle (CAREFINE PEN NEEDLES) 32G X 4 MM MISC Use 1x a dau (Patient not taking: Reported on 02/28/2015) 100 each 2  . insulin regular (HUMULIN R) 100 units/mL injection Inject 0.04-0.05 mLs (4-5 Units total) into the skin 3 (three) times daily before meals. ReliOn insulin (Patient not taking:  Reported on 02/28/2015) 10 mL 2  . Insulin Syringe-Needle U-100 (B-D INS SYRINGE 0.5CC/30GX1/2") 30G X 1/2" 0.5 ML MISC Use 3x a day (Patient not taking: Reported on 02/28/2015) 100 each 11   No current facility-administered medications for this visit.    No Known Allergies  On ROS today: HD via LIJV HD, no significant changes in health since last appointment   Physical Examination  Filed Vitals:   02/28/15 0950  BP: 117/74  Pulse: 57  Temp: 98.3 F (36.8 C)  TempSrc: Oral  Resp: 14  Height: 5\' 2"  (1.575 m)  Weight: 136 lb (61.689 kg)  SpO2: 100%   Body mass index is 24.87 kg/(m^2).  General: A&O x 3, WD, thin  Pulmonary: Sym exp, good air movt, CTAB, no rales, rhonchi, & wheezing  Cardiac: RRR, Nl S1, S2, no Murmurs, rubs or gallops  Vascular: Vessel Right Left  Radial Palpable Palpable  Ulnar Faintly Palpable Faintly Palpable  Brachial Palpable Palpable   Gastrointestinal: soft, NTND, no G/R, bo HSM, no masses, no CVAT B  Musculoskeletal: M/S 5/5 throughout , Extremities without  ischemic changes , no palpable thrill in access in LUA AVG, no bruit in access  Neurologic: Pain and light touch intact in extremities , Motor exam as listed above    Medical Decision Making  Yolanda Rush is a 64 y.o. female who presents with ESRD requiring hemodialysis.    This  patient's veins are extremely small.  I would proceed with B vein mapping to see what her options for access are.  Risk to this procedure include anaphlyactic reaction to contrast.  The patient has agreed to proceed with the above procedure which will be scheduled 7 NOV 16.   Leonides Sake, MD [facility] Office: [phone] Pager: [phone]  02/28/2015, 10:14 AM

## 2015-03-11 ENCOUNTER — Encounter (HOSPITAL_COMMUNITY): Admission: EM | Disposition: E | Payer: Medicare Other | Source: Home / Self Care | Attending: Pulmonary Disease

## 2015-03-11 ENCOUNTER — Inpatient Hospital Stay (HOSPITAL_COMMUNITY): Payer: Medicare Other

## 2015-03-11 ENCOUNTER — Inpatient Hospital Stay (HOSPITAL_COMMUNITY)
Admission: EM | Admit: 2015-03-11 | Discharge: 2015-04-10 | DRG: 246 | Disposition: E | Payer: Medicare Other | Attending: Pulmonary Disease | Admitting: Pulmonary Disease

## 2015-03-11 ENCOUNTER — Encounter (HOSPITAL_COMMUNITY): Payer: Self-pay | Admitting: *Deleted

## 2015-03-11 ENCOUNTER — Encounter (HOSPITAL_COMMUNITY): Admission: EM | Disposition: E | Payer: Self-pay | Source: Home / Self Care | Attending: Pulmonary Disease

## 2015-03-11 ENCOUNTER — Emergency Department (HOSPITAL_COMMUNITY): Payer: Medicare Other

## 2015-03-11 DIAGNOSIS — R001 Bradycardia, unspecified: Secondary | ICD-10-CM | POA: Diagnosis present

## 2015-03-11 DIAGNOSIS — I120 Hypertensive chronic kidney disease with stage 5 chronic kidney disease or end stage renal disease: Secondary | ICD-10-CM | POA: Diagnosis present

## 2015-03-11 DIAGNOSIS — I2510 Atherosclerotic heart disease of native coronary artery without angina pectoris: Secondary | ICD-10-CM | POA: Diagnosis present

## 2015-03-11 DIAGNOSIS — E11641 Type 2 diabetes mellitus with hypoglycemia with coma: Secondary | ICD-10-CM | POA: Diagnosis present

## 2015-03-11 DIAGNOSIS — E162 Hypoglycemia, unspecified: Secondary | ICD-10-CM

## 2015-03-11 DIAGNOSIS — J690 Pneumonitis due to inhalation of food and vomit: Secondary | ICD-10-CM | POA: Diagnosis present

## 2015-03-11 DIAGNOSIS — E876 Hypokalemia: Secondary | ICD-10-CM | POA: Diagnosis present

## 2015-03-11 DIAGNOSIS — R079 Chest pain, unspecified: Secondary | ICD-10-CM | POA: Diagnosis present

## 2015-03-11 DIAGNOSIS — G9340 Encephalopathy, unspecified: Secondary | ICD-10-CM

## 2015-03-11 DIAGNOSIS — E872 Acidosis: Secondary | ICD-10-CM | POA: Diagnosis present

## 2015-03-11 DIAGNOSIS — E1165 Type 2 diabetes mellitus with hyperglycemia: Secondary | ICD-10-CM | POA: Diagnosis present

## 2015-03-11 DIAGNOSIS — E875 Hyperkalemia: Secondary | ICD-10-CM | POA: Diagnosis present

## 2015-03-11 DIAGNOSIS — I2119 ST elevation (STEMI) myocardial infarction involving other coronary artery of inferior wall: Secondary | ICD-10-CM | POA: Diagnosis not present

## 2015-03-11 DIAGNOSIS — G9341 Metabolic encephalopathy: Secondary | ICD-10-CM | POA: Diagnosis present

## 2015-03-11 DIAGNOSIS — J9600 Acute respiratory failure, unspecified whether with hypoxia or hypercapnia: Secondary | ICD-10-CM

## 2015-03-11 DIAGNOSIS — J9601 Acute respiratory failure with hypoxia: Secondary | ICD-10-CM | POA: Diagnosis present

## 2015-03-11 DIAGNOSIS — Z992 Dependence on renal dialysis: Secondary | ICD-10-CM

## 2015-03-11 DIAGNOSIS — Z7982 Long term (current) use of aspirin: Secondary | ICD-10-CM | POA: Diagnosis not present

## 2015-03-11 DIAGNOSIS — K7200 Acute and subacute hepatic failure without coma: Secondary | ICD-10-CM | POA: Diagnosis not present

## 2015-03-11 DIAGNOSIS — E871 Hypo-osmolality and hyponatremia: Secondary | ICD-10-CM | POA: Diagnosis present

## 2015-03-11 DIAGNOSIS — E1122 Type 2 diabetes mellitus with diabetic chronic kidney disease: Secondary | ICD-10-CM | POA: Diagnosis present

## 2015-03-11 DIAGNOSIS — N186 End stage renal disease: Secondary | ICD-10-CM | POA: Diagnosis not present

## 2015-03-11 DIAGNOSIS — E785 Hyperlipidemia, unspecified: Secondary | ICD-10-CM | POA: Diagnosis present

## 2015-03-11 DIAGNOSIS — D638 Anemia in other chronic diseases classified elsewhere: Secondary | ICD-10-CM | POA: Diagnosis present

## 2015-03-11 DIAGNOSIS — R570 Cardiogenic shock: Secondary | ICD-10-CM | POA: Diagnosis not present

## 2015-03-11 DIAGNOSIS — I2111 ST elevation (STEMI) myocardial infarction involving right coronary artery: Secondary | ICD-10-CM | POA: Diagnosis present

## 2015-03-11 DIAGNOSIS — K922 Gastrointestinal hemorrhage, unspecified: Secondary | ICD-10-CM | POA: Diagnosis present

## 2015-03-11 DIAGNOSIS — Z794 Long term (current) use of insulin: Secondary | ICD-10-CM

## 2015-03-11 DIAGNOSIS — I213 ST elevation (STEMI) myocardial infarction of unspecified site: Secondary | ICD-10-CM

## 2015-03-11 DIAGNOSIS — Z66 Do not resuscitate: Secondary | ICD-10-CM | POA: Diagnosis present

## 2015-03-11 HISTORY — PX: CARDIAC CATHETERIZATION: SHX172

## 2015-03-11 LAB — I-STAT CHEM 8, ED
BUN: 57 mg/dL — AB (ref 6–20)
BUN: 58 mg/dL — AB (ref 6–20)
CALCIUM ION: 1.06 mmol/L — AB (ref 1.13–1.30)
CALCIUM ION: 1.09 mmol/L — AB (ref 1.13–1.30)
CHLORIDE: 100 mmol/L — AB (ref 101–111)
CHLORIDE: 100 mmol/L — AB (ref 101–111)
CREATININE: 6.9 mg/dL — AB (ref 0.44–1.00)
CREATININE: 7.2 mg/dL — AB (ref 0.44–1.00)
GLUCOSE: 141 mg/dL — AB (ref 65–99)
GLUCOSE: 96 mg/dL (ref 65–99)
HCT: 41 % (ref 36.0–46.0)
HEMATOCRIT: 40 % (ref 36.0–46.0)
Hemoglobin: 13.6 g/dL (ref 12.0–15.0)
Hemoglobin: 13.9 g/dL (ref 12.0–15.0)
POTASSIUM: 7 mmol/L — AB (ref 3.5–5.1)
Potassium: 7.4 mmol/L (ref 3.5–5.1)
SODIUM: 130 mmol/L — AB (ref 135–145)
Sodium: 129 mmol/L — ABNORMAL LOW (ref 135–145)
TCO2: 10 mmol/L (ref 0–100)
TCO2: 11 mmol/L (ref 0–100)

## 2015-03-11 LAB — BASIC METABOLIC PANEL
ANION GAP: 22 — AB (ref 5–15)
BUN: 29 mg/dL — ABNORMAL HIGH (ref 6–20)
CALCIUM: 9.7 mg/dL (ref 8.9–10.3)
CO2: 22 mmol/L (ref 22–32)
CREATININE: 3.79 mg/dL — AB (ref 0.44–1.00)
Chloride: 92 mmol/L — ABNORMAL LOW (ref 101–111)
GFR calc non Af Amer: 12 mL/min — ABNORMAL LOW (ref >60)
GFR, EST AFRICAN AMERICAN: 13 mL/min — AB (ref >60)
Glucose, Bld: 220 mg/dL — ABNORMAL HIGH (ref 65–99)
Potassium: 4.6 mmol/L (ref 3.5–5.1)
Sodium: 136 mmol/L (ref 135–145)

## 2015-03-11 LAB — GLUCOSE, CAPILLARY
GLUCOSE-CAPILLARY: 136 mg/dL — AB (ref 65–99)
GLUCOSE-CAPILLARY: 116 mg/dL — AB (ref 65–99)
Glucose-Capillary: 24 mg/dL — CL (ref 65–99)
Glucose-Capillary: 73 mg/dL (ref 65–99)
Glucose-Capillary: 28 mg/dL — CL (ref 65–99)

## 2015-03-11 LAB — CBC WITH DIFFERENTIAL/PLATELET
BASOS ABS: 0 10*3/uL (ref 0.0–0.1)
BASOS PCT: 0 %
EOS ABS: 0 10*3/uL (ref 0.0–0.7)
Eosinophils Relative: 0 %
HEMATOCRIT: 37.5 % (ref 36.0–46.0)
HEMOGLOBIN: 12 g/dL (ref 12.0–15.0)
LYMPHS PCT: 6 %
Lymphs Abs: 1.9 10*3/uL (ref 0.7–4.0)
MCH: 28.6 pg (ref 26.0–34.0)
MCHC: 32 g/dL (ref 30.0–36.0)
MCV: 89.3 fL (ref 78.0–100.0)
MONOS PCT: 8 %
Monocytes Absolute: 2.5 10*3/uL — ABNORMAL HIGH (ref 0.1–1.0)
NEUTROS ABS: 27.4 10*3/uL — AB (ref 1.7–7.7)
Neutrophils Relative %: 86 %
Platelets: 80 10*3/uL — ABNORMAL LOW (ref 150–400)
RBC: 4.2 MIL/uL (ref 3.87–5.11)
RDW: 14.9 % (ref 11.5–15.5)
WBC: 31.8 10*3/uL — AB (ref 4.0–10.5)

## 2015-03-11 LAB — BRAIN NATRIURETIC PEPTIDE: B Natriuretic Peptide: >4500 pg/mL — ABNORMAL HIGH (ref 0.0–100.0)

## 2015-03-11 LAB — COMPREHENSIVE METABOLIC PANEL
ALBUMIN: 2.9 g/dL — AB (ref 3.5–5.0)
ALK PHOS: 85 U/L (ref 38–126)
ALT: 320 U/L — AB (ref 14–54)
AST: 716 U/L — ABNORMAL HIGH (ref 15–41)
Anion gap: 31 — ABNORMAL HIGH (ref 5–15)
BILIRUBIN TOTAL: 1.7 mg/dL — AB (ref 0.3–1.2)
BUN: 59 mg/dL — AB (ref 6–20)
CALCIUM: 9.9 mg/dL (ref 8.9–10.3)
CO2: 10 mmol/L — AB (ref 22–32)
CREATININE: 7.27 mg/dL — AB (ref 0.44–1.00)
Chloride: 93 mmol/L — ABNORMAL LOW (ref 101–111)
GFR calc Af Amer: 6 mL/min — ABNORMAL LOW (ref >60)
GFR calc non Af Amer: 5 mL/min — ABNORMAL LOW (ref >60)
GLUCOSE: 148 mg/dL — AB (ref 65–99)
Potassium: >7.5 mmol/L (ref 3.5–5.1)
SODIUM: 134 mmol/L — AB (ref 135–145)
TOTAL PROTEIN: 6.9 g/dL (ref 6.5–8.1)

## 2015-03-11 LAB — CBC
HCT: 30.9 % — ABNORMAL LOW (ref 36.0–46.0)
HEMATOCRIT: 38.8 % (ref 36.0–46.0)
HEMOGLOBIN: 12.9 g/dL (ref 12.0–15.0)
HEMOGLOBIN: 10 g/dL — AB (ref 12.0–15.0)
MCH: 28.7 pg (ref 26.0–34.0)
MCH: 28 pg (ref 26.0–34.0)
MCHC: 33.2 g/dL (ref 30.0–36.0)
MCHC: 32.4 g/dL (ref 30.0–36.0)
MCV: 86.2 fL (ref 78.0–100.0)
MCV: 86.6 fL (ref 78.0–100.0)
PLATELETS: 91 10*3/uL — AB (ref 150–400)
Platelets: 81 10*3/uL — ABNORMAL LOW (ref 150–400)
RBC: 4.5 MIL/uL (ref 3.87–5.11)
RBC: 3.57 MIL/uL — AB (ref 3.87–5.11)
RDW: 14.8 % (ref 11.5–15.5)
RDW: 14.7 % (ref 11.5–15.5)
WBC: 22.5 10*3/uL — ABNORMAL HIGH (ref 4.0–10.5)
WBC: 14.9 10*3/uL — AB (ref 4.0–10.5)

## 2015-03-11 LAB — POCT I-STAT, CHEM 8
BUN: 56 mg/dL — AB (ref 6–20)
BUN: 52 mg/dL — AB (ref 6–20)
CHLORIDE: 97 mmol/L — AB (ref 101–111)
CREATININE: 6.4 mg/dL — AB (ref 0.44–1.00)
Calcium, Ion: 1.31 mmol/L — ABNORMAL HIGH (ref 1.13–1.30)
Calcium, Ion: 1.18 mmol/L (ref 1.13–1.30)
Chloride: 98 mmol/L — ABNORMAL LOW (ref 101–111)
Creatinine, Ser: 6.3 mg/dL — ABNORMAL HIGH (ref 0.44–1.00)
GLUCOSE: 414 mg/dL — AB (ref 65–99)
Glucose, Bld: 499 mg/dL — ABNORMAL HIGH (ref 65–99)
HCT: 34 % — ABNORMAL LOW (ref 36.0–46.0)
HEMATOCRIT: 35 % — AB (ref 36.0–46.0)
Hemoglobin: 11.9 g/dL — ABNORMAL LOW (ref 12.0–15.0)
Hemoglobin: 11.6 g/dL — ABNORMAL LOW (ref 12.0–15.0)
POTASSIUM: 6.5 mmol/L — AB (ref 3.5–5.1)
Potassium: 5.9 mmol/L — ABNORMAL HIGH (ref 3.5–5.1)
SODIUM: 129 mmol/L — AB (ref 135–145)
Sodium: 128 mmol/L — ABNORMAL LOW (ref 135–145)
TCO2: 12 mmol/L (ref 0–100)
TCO2: 15 mmol/L (ref 0–100)

## 2015-03-11 LAB — BLOOD GAS, ARTERIAL
ACID-BASE DEFICIT: 2.2 mmol/L — AB (ref 0.0–2.0)
Bicarbonate: 19.9 mEq/L — ABNORMAL LOW (ref 20.0–24.0)
DRAWN BY: 441371
FIO2: 0.8
LHR: 18 {breaths}/min
MECHVT: 400 mL
O2 SAT: 99.7 %
PEEP/CPAP: 5 cmH2O
PH ART: 7.592 — AB (ref 7.350–7.450)
PO2 ART: 346 mmHg — AB (ref 80.0–100.0)
Patient temperature: 94.7
TCO2: 20.6 mmol/L (ref 0–100)
pCO2 arterial: 20.1 mmHg — ABNORMAL LOW (ref 35.0–45.0)

## 2015-03-11 LAB — POCT I-STAT 3, ART BLOOD GAS (G3+)
ACID-BASE DEFICIT: 12 mmol/L — AB (ref 0.0–2.0)
Acid-base deficit: 15 mmol/L — ABNORMAL HIGH (ref 0.0–2.0)
BICARBONATE: 10.8 meq/L — AB (ref 20.0–24.0)
BICARBONATE: 14.4 meq/L — AB (ref 20.0–24.0)
O2 SAT: 100 %
O2 Saturation: 100 %
PCO2 ART: 26.8 mmHg — AB (ref 35.0–45.0)
PCO2 ART: 35.1 mmHg (ref 35.0–45.0)
PH ART: 7.213 — AB (ref 7.350–7.450)
PO2 ART: 569 mmHg — AB (ref 80.0–100.0)
PO2 ART: 608 mmHg — AB (ref 80.0–100.0)
TCO2: 12 mmol/L (ref 0–100)
TCO2: 15 mmol/L (ref 0–100)
pH, Arterial: 7.221 — ABNORMAL LOW (ref 7.350–7.450)

## 2015-03-11 LAB — POCT ACTIVATED CLOTTING TIME
ACTIVATED CLOTTING TIME: 202 s
ACTIVATED CLOTTING TIME: 177 s
Activated Clotting Time: 245 seconds

## 2015-03-11 LAB — TROPONIN I

## 2015-03-11 LAB — I-STAT TROPONIN, ED

## 2015-03-11 LAB — MRSA PCR SCREENING: MRSA by PCR: NEGATIVE

## 2015-03-11 SURGERY — CORONARY ANGIOGRAM

## 2015-03-11 SURGERY — LEFT HEART CATH AND CORONARY ANGIOGRAPHY

## 2015-03-11 MED ORDER — INSULIN ASPART 100 UNIT/ML ~~LOC~~ SOLN: 2.0000 [IU] | SUBCUTANEOUS | Status: DC

## 2015-03-11 MED ORDER — DEXTROSE-NACL 5-0.45 % IV SOLN
INTRAVENOUS | Status: DC
  Administered 2015-03-11 – 2015-03-13 (×4): via INTRAVENOUS

## 2015-03-11 MED ORDER — LIDOCAINE HCL (PF) 1 % IJ SOLN: 5.0000 mL | INTRAMUSCULAR | Status: DC | PRN

## 2015-03-11 MED ORDER — HEPARIN (PORCINE) IN NACL 100-0.45 UNIT/ML-% IJ SOLN
INTRAMUSCULAR | Status: AC
  Filled 2015-03-11: qty 250

## 2015-03-11 MED ORDER — DEXTROSE 50 % IV SOLN: 1.0000 | Freq: Once | INTRAVENOUS | Status: AC

## 2015-03-11 MED ORDER — INSULIN ASPART 100 UNIT/ML ~~LOC~~ SOLN: 0.0000 [IU] | Freq: Three times a day (TID) | SUBCUTANEOUS | Status: DC

## 2015-03-11 MED ORDER — DEXTROSE 50 % IV SOLN
1.0000 | Freq: Once | INTRAVENOUS | Status: AC
  Administered 2015-03-11: 50 mL via INTRAVENOUS
  Filled 2015-03-11: qty 50

## 2015-03-11 MED ORDER — TICAGRELOR 90 MG PO TABS
90.0000 mg | ORAL_TABLET | Freq: Two times a day (BID) | ORAL | Status: DC
  Administered 2015-03-12 (×2): 90 mg via ORAL
  Filled 2015-03-11 (×2): qty 1

## 2015-03-11 MED ORDER — HEPARIN (PORCINE) IN NACL 100-0.45 UNIT/ML-% IJ SOLN
700.0000 [IU]/h | INTRAMUSCULAR | Status: DC
  Administered 2015-03-11: 700 [IU]/h via INTRAVENOUS

## 2015-03-11 MED ORDER — LIDOCAINE HCL (PF) 1 % IJ SOLN
INTRAMUSCULAR | Status: DC | PRN
  Administered 2015-03-11: 11:00:00

## 2015-03-11 MED ORDER — SODIUM CHLORIDE 0.9 % IJ SOLN: 3.0000 mL | INTRAMUSCULAR | Status: DC | PRN

## 2015-03-11 MED ORDER — BIVALIRUDIN BOLUS VIA INFUSION - CUPID
INTRAVENOUS | Status: DC | PRN
  Administered 2015-03-11 (×2): 46.275 mg via INTRAVENOUS

## 2015-03-11 MED ORDER — TICAGRELOR 90 MG PO TABS
180.0000 mg | ORAL_TABLET | Freq: Once | ORAL | Status: AC
  Administered 2015-03-11: 180 mg
  Filled 2015-03-11 (×2): qty 2

## 2015-03-11 MED ORDER — NOREPINEPHRINE BITARTRATE 1 MG/ML IV SOLN
0.0000 ug/min | INTRAVENOUS | Status: DC
  Administered 2015-03-11: 40 ug/min via INTRAVENOUS
  Filled 2015-03-11: qty 4

## 2015-03-11 MED ORDER — NITROGLYCERIN 1 MG/10 ML FOR IR/CATH LAB
INTRA_ARTERIAL | Status: AC
Start: 2015-03-11 — End: 2015-03-11
  Filled 2015-03-11: qty 10

## 2015-03-11 MED ORDER — TIROFIBAN HCL IN NACL 5-0.9 MG/100ML-% IV SOLN
INTRAVENOUS | Status: AC
  Filled 2015-03-11: qty 100

## 2015-03-11 MED ORDER — METOPROLOL TARTRATE 12.5 MG HALF TABLET: 12.5000 mg | ORAL_TABLET | Freq: Two times a day (BID) | ORAL | Status: DC

## 2015-03-11 MED ORDER — ETOMIDATE 2 MG/ML IV SOLN
INTRAVENOUS | Status: AC
  Filled 2015-03-11: qty 20

## 2015-03-11 MED ORDER — ASPIRIN 300 MG RE SUPP
600.0000 mg | Freq: Once | RECTAL | Status: AC
  Administered 2015-03-11: 600 mg via RECTAL
  Filled 2015-03-11: qty 2

## 2015-03-11 MED ORDER — SODIUM BICARBONATE 8.4 % IV SOLN
INTRAVENOUS | Status: DC | PRN
  Administered 2015-03-11 (×2): 50 meq via INTRAVENOUS

## 2015-03-11 MED ORDER — ONDANSETRON HCL 4 MG/2ML IJ SOLN: 4.0000 mg | Freq: Four times a day (QID) | INTRAMUSCULAR | Status: DC | PRN

## 2015-03-11 MED ORDER — CALCIUM CHLORIDE 10 % IV SOLN
INTRAVENOUS | Status: AC
  Filled 2015-03-11: qty 10

## 2015-03-11 MED ORDER — LIDOCAINE HCL (PF) 1 % IJ SOLN
INTRAMUSCULAR | Status: DC | PRN
  Administered 2015-03-11: 10 mL

## 2015-03-11 MED ORDER — HEPARIN SODIUM (PORCINE) 1000 UNIT/ML DIALYSIS: 1000.0000 [IU] | INTRAMUSCULAR | Status: DC | PRN

## 2015-03-11 MED ORDER — HEPARIN BOLUS VIA INFUSION
4000.0000 [IU] | Freq: Once | INTRAVENOUS | Status: AC
  Administered 2015-03-11: 4000 [IU] via INTRAVENOUS
  Filled 2015-03-11: qty 4000

## 2015-03-11 MED ORDER — SODIUM BICARBONATE 8.4 % IV SOLN: 50.0000 meq | Freq: Once | INTRAVENOUS | Status: DC

## 2015-03-11 MED ORDER — HEPARIN (PORCINE) IN NACL 2-0.9 UNIT/ML-% IJ SOLN
INTRAMUSCULAR | Status: AC
  Filled 2015-03-11: qty 1000

## 2015-03-11 MED ORDER — SUCCINYLCHOLINE CHLORIDE 20 MG/ML IJ SOLN
INTRAMUSCULAR | Status: AC
  Filled 2015-03-11: qty 1

## 2015-03-11 MED ORDER — DOPAMINE-DEXTROSE 3.2-5 MG/ML-% IV SOLN
0.0000 ug/kg/min | INTRAVENOUS | Status: DC
  Administered 2015-03-11: 20 ug/kg/min via INTRAVENOUS

## 2015-03-11 MED ORDER — ATORVASTATIN CALCIUM 40 MG PO TABS
40.0000 mg | ORAL_TABLET | Freq: Every day | ORAL | Status: DC
  Administered 2015-03-11 – 2015-03-12 (×2): 40 mg via ORAL
  Filled 2015-03-11 (×2): qty 1

## 2015-03-11 MED ORDER — IOHEXOL 350 MG/ML SOLN
INTRAVENOUS | Status: DC | PRN
  Administered 2015-03-11: 30 mL via INTRA_ARTERIAL

## 2015-03-11 MED ORDER — NITROGLYCERIN 0.4 MG SL SUBL: 0.4000 mg | SUBLINGUAL_TABLET | SUBLINGUAL | Status: DC | PRN

## 2015-03-11 MED ORDER — CHLORHEXIDINE GLUCONATE 0.12% ORAL RINSE (MEDLINE KIT)
15.0000 mL | Freq: Two times a day (BID) | OROMUCOSAL | Status: DC
  Administered 2015-03-11 – 2015-03-13 (×4): 15 mL via OROMUCOSAL

## 2015-03-11 MED ORDER — SODIUM CHLORIDE 0.9 % IV BOLUS (SEPSIS)
250.0000 mL | Freq: Once | INTRAVENOUS | Status: AC
  Administered 2015-03-11: 250 mL via INTRAVENOUS

## 2015-03-11 MED ORDER — SODIUM CHLORIDE 0.9 % IJ SOLN
3.0000 mL | Freq: Two times a day (BID) | INTRAMUSCULAR | Status: DC
  Administered 2015-03-11 – 2015-03-12 (×2): 3 mL via INTRAVENOUS

## 2015-03-11 MED ORDER — HEPARIN SODIUM (PORCINE) 5000 UNIT/ML IJ SOLN: 60.0000 [IU]/kg | Freq: Once | INTRAMUSCULAR | Status: DC

## 2015-03-11 MED ORDER — SODIUM BICARBONATE 8.4 % IV SOLN
INTRAVENOUS | Status: AC
  Filled 2015-03-11: qty 50

## 2015-03-11 MED ORDER — SODIUM CHLORIDE 0.9 % IV BOLUS (SEPSIS): 500.0000 mL | Freq: Once | INTRAVENOUS | Status: DC

## 2015-03-11 MED ORDER — ANTISEPTIC ORAL RINSE SOLUTION (CORINZ)
7.0000 mL | Freq: Four times a day (QID) | OROMUCOSAL | Status: DC
  Administered 2015-03-12 – 2015-03-13 (×6): 7 mL via OROMUCOSAL

## 2015-03-11 MED ORDER — DEXTROSE 50 % IV SOLN
INTRAVENOUS | Status: DC | PRN
  Administered 2015-03-11 (×2): 1 via INTRAVENOUS

## 2015-03-11 MED ORDER — TIROFIBAN (AGGRASTAT) BOLUS VIA INFUSION
INTRAVENOUS | Status: DC | PRN
  Administered 2015-03-11: 1542.5 ug via INTRAVENOUS

## 2015-03-11 MED ORDER — SODIUM CHLORIDE 0.9 % IV SOLN
250.0000 mL | INTRAVENOUS | Status: DC | PRN
Start: 2015-03-11 — End: 2015-03-13

## 2015-03-11 MED ORDER — SODIUM CHLORIDE 0.9 % IV SOLN
8.0000 mg/h | INTRAVENOUS | Status: DC
  Administered 2015-03-11: 8 mg/h via INTRAVENOUS
  Filled 2015-03-11 (×4): qty 80

## 2015-03-11 MED ORDER — SODIUM BICARBONATE 8.4 % IV SOLN
INTRAVENOUS | Status: DC | PRN
  Administered 2015-03-11: 50 meq via INTRAVENOUS

## 2015-03-11 MED ORDER — ROCURONIUM BROMIDE 50 MG/5ML IV SOLN
INTRAVENOUS | Status: DC | PRN
  Administered 2015-03-11: 100 mg via INTRAVENOUS

## 2015-03-11 MED ORDER — LIDOCAINE HCL (PF) 1 % IJ SOLN
INTRAMUSCULAR | Status: AC
  Filled 2015-03-11: qty 30

## 2015-03-11 MED ORDER — ALTEPLASE 2 MG IJ SOLR
2.0000 mg | Freq: Once | INTRAMUSCULAR | Status: DC | PRN
  Filled 2015-03-11: qty 2

## 2015-03-11 MED ORDER — BIVALIRUDIN 250 MG IV SOLR
250.0000 mg | INTRAVENOUS | Status: DC | PRN
  Administered 2015-03-11: 0.25 mg/kg/h via INTRAVENOUS

## 2015-03-11 MED ORDER — RENA-VITE PO TABS
1.0000 | ORAL_TABLET | Freq: Every day | ORAL | Status: DC
  Administered 2015-03-12: 1 via ORAL
  Filled 2015-03-11: qty 1

## 2015-03-11 MED ORDER — SODIUM CHLORIDE 0.9 % IV SOLN: 250.0000 mL | INTRAVENOUS | Status: DC | PRN

## 2015-03-11 MED ORDER — CALCIUM CHLORIDE 10 % IV SOLN
INTRAVENOUS | Status: DC | PRN
  Administered 2015-03-11: 1 g via INTRAVENOUS

## 2015-03-11 MED ORDER — SODIUM CHLORIDE 0.9 % IV SOLN: 100.0000 mL | INTRAVENOUS | Status: DC | PRN

## 2015-03-11 MED ORDER — ASPIRIN 81 MG PO CHEW: 324.0000 mg | CHEWABLE_TABLET | Freq: Once | ORAL | Status: DC

## 2015-03-11 MED ORDER — ETOMIDATE 2 MG/ML IV SOLN
INTRAVENOUS | Status: DC | PRN
  Administered 2015-03-11: 20 mg via INTRAVENOUS

## 2015-03-11 MED ORDER — LIDOCAINE HCL (CARDIAC) 20 MG/ML IV SOLN
INTRAVENOUS | Status: AC
  Filled 2015-03-11: qty 5

## 2015-03-11 MED ORDER — SODIUM CHLORIDE 0.9 % IV SOLN
80.0000 mg | Freq: Once | INTRAVENOUS | Status: AC
  Administered 2015-03-11: 80 mg via INTRAVENOUS
  Filled 2015-03-11: qty 80

## 2015-03-11 MED ORDER — DEXTROSE 5 % IV SOLN
Freq: Once | INTRAVENOUS | Status: AC
  Administered 2015-03-11: 09:00:00 via INTRAVENOUS

## 2015-03-11 MED ORDER — TIROFIBAN HCL IV 12.5 MG/250 ML: 0.0750 ug/kg/min | INTRAVENOUS | Status: AC

## 2015-03-11 MED ORDER — LIDOCAINE-PRILOCAINE 2.5-2.5 % EX CREA
1.0000 "application " | TOPICAL_CREAM | CUTANEOUS | Status: DC | PRN
  Filled 2015-03-11: qty 5

## 2015-03-11 MED ORDER — HEPARIN (PORCINE) IN NACL 2-0.9 UNIT/ML-% IJ SOLN
INTRAMUSCULAR | Status: AC
  Filled 2015-03-11: qty 1500

## 2015-03-11 MED ORDER — BIVALIRUDIN 250 MG IV SOLR
INTRAVENOUS | Status: AC
  Filled 2015-03-11: qty 250

## 2015-03-11 MED ORDER — TIROFIBAN HCL IN NACL 5-0.9 MG/100ML-% IV SOLN
INTRAVENOUS | Status: DC | PRN
  Administered 2015-03-11: 0.075 ug/kg/min via INTRAVENOUS

## 2015-03-11 MED ORDER — ASPIRIN EC 81 MG PO TBEC
81.0000 mg | DELAYED_RELEASE_TABLET | Freq: Every day | ORAL | Status: DC
  Administered 2015-03-12: 81 mg via ORAL
  Filled 2015-03-11: qty 1

## 2015-03-11 MED ORDER — HEPARIN BOLUS VIA INFUSION: 4000.0000 [IU] | Freq: Once | INTRAVENOUS | Status: DC

## 2015-03-11 MED ORDER — PANTOPRAZOLE SODIUM 40 MG IV SOLR: 40.0000 mg | Freq: Two times a day (BID) | INTRAVENOUS | Status: DC

## 2015-03-11 MED ORDER — ROCURONIUM BROMIDE 50 MG/5ML IV SOLN
INTRAVENOUS | Status: AC
  Filled 2015-03-11: qty 2

## 2015-03-11 MED ORDER — HEPARIN (PORCINE) IN NACL 100-0.45 UNIT/ML-% IJ SOLN
800.0000 [IU]/h | INTRAMUSCULAR | Status: DC
  Administered 2015-03-11: 800 [IU]/h via INTRAVENOUS
  Filled 2015-03-11: qty 250

## 2015-03-11 MED ORDER — FENTANYL CITRATE (PF) 100 MCG/2ML IJ SOLN: 25.0000 ug | INTRAMUSCULAR | Status: DC | PRN

## 2015-03-11 MED ORDER — TIROFIBAN HCL IV 12.5 MG/250 ML: 0.0750 ug/kg/min | INTRAVENOUS | Status: DC

## 2015-03-11 MED ORDER — ACETAMINOPHEN 325 MG PO TABS
650.0000 mg | ORAL_TABLET | ORAL | Status: DC | PRN
  Administered 2015-03-12: 650 mg via ORAL
  Filled 2015-03-11: qty 2

## 2015-03-11 MED ORDER — INSULIN ASPART 100 UNIT/ML ~~LOC~~ SOLN: 4.0000 [IU] | Freq: Three times a day (TID) | SUBCUTANEOUS | Status: DC

## 2015-03-11 MED ORDER — PENTAFLUOROPROP-TETRAFLUOROETH EX AERO: 1.0000 "application " | INHALATION_SPRAY | CUTANEOUS | Status: DC | PRN

## 2015-03-11 MED ORDER — DEXTROSE 50 % IV SOLN
INTRAVENOUS | Status: AC
  Filled 2015-03-11: qty 100

## 2015-03-11 MED ORDER — CALCIUM CHLORIDE 10 % IV SOLN: 1.0000 g | Freq: Once | INTRAVENOUS | Status: DC

## 2015-03-11 MED ORDER — NITROGLYCERIN IN D5W 200-5 MCG/ML-% IV SOLN
0.0000 ug/min | INTRAVENOUS | Status: DC
  Administered 2015-03-11: 5 ug/min via INTRAVENOUS

## 2015-03-11 MED ORDER — ALBUTEROL SULFATE (2.5 MG/3ML) 0.083% IN NEBU: 2.5000 mg | INHALATION_SOLUTION | RESPIRATORY_TRACT | Status: DC | PRN

## 2015-03-11 MED ORDER — VANCOMYCIN HCL IN DEXTROSE 1-5 GM/200ML-% IV SOLN
1000.0000 mg | INTRAVENOUS | Status: AC
  Administered 2015-03-12: 1000 mg via INTRAVENOUS
  Filled 2015-03-11: qty 200

## 2015-03-11 MED ORDER — NITROGLYCERIN IN D5W 200-5 MCG/ML-% IV SOLN
INTRAVENOUS | Status: AC
  Filled 2015-03-11: qty 250

## 2015-03-11 MED FILL — Medication: Qty: 1 | Status: AC

## 2015-03-11 SURGICAL SUPPLY — 28 items
BALLN EMERGE MR 2.0X15 (BALLOONS) ×2
BALLN EUPHORA RX 2.0X15 (BALLOONS) ×2
BALLN ~~LOC~~ EMERGE MR 3.0X15 (BALLOONS) ×2
BALLN ~~LOC~~ EMERGE MR 3.25X20 (BALLOONS) ×2
BALLN ~~LOC~~ EMERGE MR 3.5X20 (BALLOONS) ×2
BALLOON EMERGE MR 2.0X15 (BALLOONS) ×1 IMPLANT
BALLOON EUPHORA RX 2.0X15 (BALLOONS) ×1 IMPLANT
BALLOON ~~LOC~~ EMERGE MR 3.0X15 (BALLOONS) ×1 IMPLANT
BALLOON ~~LOC~~ EMERGE MR 3.25X20 (BALLOONS) ×1 IMPLANT
BALLOON ~~LOC~~ EMERGE MR 3.5X20 (BALLOONS) ×1 IMPLANT
CATH INFINITI 5 FR AR2 MOD (CATHETERS) ×2 IMPLANT
CATH INFINITI 5FR AL1 (CATHETERS) ×2 IMPLANT
CATH INFINITI 5FR MULTPACK ANG (CATHETERS) IMPLANT
CATH SITESEER 5F NTR (CATHETERS) ×2 IMPLANT
GUIDE CATH RUNWAY 6FR HS (CATHETERS) ×2 IMPLANT
GUIDE CATH RUNWAY 6FR RC3.5 ~~LOC~~ (CATHETERS) ×2 IMPLANT
GUIDELINER 6F (CATHETERS) ×2 IMPLANT
KIT ENCORE 26 ADVANTAGE (KITS) ×2 IMPLANT
KIT HEART LEFT (KITS) ×2 IMPLANT
PACK CARDIAC CATHETERIZATION (CUSTOM PROCEDURE TRAY) ×2 IMPLANT
SHEATH PINNACLE 5F 10CM (SHEATH) ×2 IMPLANT
SHEATH PINNACLE 6F 10CM (SHEATH) ×2 IMPLANT
STENT XIENCE ALPINE RX 3.0X12 (Permanent Stent) ×2 IMPLANT
STENT XIENCE ALPINE RX 3.0X38 (Permanent Stent) ×2 IMPLANT
SYR MEDRAD MARK V 150ML (SYRINGE) ×2 IMPLANT
TRANSDUCER W/STOPCOCK (MISCELLANEOUS) ×2 IMPLANT
WIRE EMERALD 3MM-J .035X150CM (WIRE) ×2 IMPLANT
WIRE PT2 MS 185 (WIRE) ×2 IMPLANT

## 2015-03-11 SURGICAL SUPPLY — 7 items
GUIDE CATH RUNWAY 6F WRP (CATHETERS) ×4 IMPLANT
GUIDEWIRE STRAIGHT .035 260CM (WIRE) IMPLANT
KIT HEART LEFT (KITS) ×4 IMPLANT
PACK CARDIAC CATHETERIZATION (CUSTOM PROCEDURE TRAY) ×4 IMPLANT
SHEATH PINNACLE 6F 10CM (SHEATH) ×4 IMPLANT
TRANSDUCER W/STOPCOCK (MISCELLANEOUS) ×4 IMPLANT
WIRE EMERALD 3MM-J .035X150CM (WIRE) ×8 IMPLANT

## 2015-03-11 NOTE — ED Provider Notes (Signed)
CSN: [id number]     Arrival date & time 03-21-15  9528 History   None    No chief complaint on file.    (Consider location/radiation/quality/duration/timing/severity/associated sxs/prior Treatment) HPI  The patient is a 64 year old female, she has a history of end-stage renal disease on dialysis, last night she started to complain about vomiting according to the family members, this morning they found the patient to be altered, confused, they found her to be hypoglycemic with a blood sugar of 20.  She was given D50 but there was no improvement in her mental status. She continued to be slightly bradycardic, diaphoretic, ill-appearing. The patient is unable to give any information. She is nonverbal, she does not follow commands, she does withdraw from painful stimuli  Past Medical History  Diagnosis Date  . Diabetes mellitus without complication (HCC)   . Renal disorder   . Hypertension    History reviewed. No pertinent past surgical history. Family History  Problem Relation Age of Onset  . Diabetes Mother   . Hyperlipidemia Father    Social History  Substance Use Topics  . Smoking status: Never Smoker   . Smokeless tobacco: Never Used  . Alcohol Use: No   OB History    No data available     Review of Systems  Unable to perform ROS: Patient unresponsive      Allergies  Review of patient's allergies indicates no known allergies.  Home Medications   Prior to Admission medications   Medication Sig Start Date End Date Taking? Authorizing Provider  Acetaminophen 500 MG TBDP Take 1 tablet by mouth as needed. Equate Brand    Historical Provider, MD  calcium acetate (PHOSLO) 667 MG capsule     Historical Provider, MD  DiphenhydrAMINE HCl (ALLERGY MED PO) Take 10 mg by mouth as needed. Equate Brand    Historical Provider, MD  glucose blood (ONE TOUCH ULTRA TEST) test strip Use to test blood sugar 3 times daily as instructed. Dx: E11.22 12/23/14   [name], MD   HYDROcodone-acetaminophen (NORCO/VICODIN) 5-325 MG per tablet Take by mouth. 01/06/15   Historical Provider, MD  Insulin Glargine (LANTUS SOLOSTAR) 100 UNIT/ML Solostar Pen Inject 12 Units into the skin daily at 10 pm. Patient not taking: Reported on 02/28/2015 02/03/15   [name], MD  Insulin Pen Needle (CAREFINE PEN NEEDLES) 32G X 4 MM MISC Use 1x a dau Patient not taking: Reported on 02/28/2015 12/13/14   [name], MD  insulin regular (HUMULIN R) 100 units/mL injection Inject 0.04-0.05 mLs (4-5 Units total) into the skin 3 (three) times daily before meals. ReliOn insulin Patient not taking: Reported on 02/28/2015 02/03/15   [name], MD  Insulin Syringe-Needle U-100 (B-D INS SYRINGE 0.5CC/30GX1/2") 30G X 1/2" 0.5 ML MISC Use 3x a day Patient not taking: Reported on 02/28/2015 02/03/15   [name], MD  lanthanum (FOSRENOL) 1000 MG chewable tablet Chew 1,000 mg by mouth 3 (three) times daily with meals.    Historical Provider, MD  metoprolol tartrate (LOPRESSOR) 25 MG tablet  07/06/14   Historical Provider, MD  multivitamin (RENA-VIT) TABS tablet Take 1 tablet by mouth daily.    Historical Provider, MD  [facility] DELICA LANCETS FINE MISC Use to test blood sugar 3 times daily. Dx:E11.22 12/23/14   [name], MD  saxagliptin HCl (ONGLYZA) 5 MG TABS tablet Take 5 mg by mouth daily.    Historical Provider, MD   BP 168/73 mmHg  Pulse 64  Resp 18  SpO2 100%  Physical Exam  Constitutional:  Ill-appearing, diaphoretic, minimally responsive  HENT:  Oropharynx clear, pale  Eyes:  Conjunctiva clear, pupils normal  Neck:  Supple neck, no JVD  Cardiovascular:  Bradycardia weak pulses  Pulmonary/Chest:  Shallow respirations. Dialysis catheter in the left upper chest wall appears clean and dry  Abdominal:  Abdomen is soft, nondistended  Musculoskeletal:  No significant edema  Neurological:  Altered mental status, does not answer questions, does not follow  commands, does respond to painful stimuli, grunting respirations  Skin:  Pale, diaphoretic    ED Course  Procedures (including critical care time) Labs Review Labs Reviewed  CBC WITH DIFFERENTIAL/PLATELET - Abnormal; Notable for the following:    WBC 31.8 (*)    Platelets 80 (*)    Neutro Abs 27.4 (*)    Monocytes Absolute 2.5 (*)    All other components within normal limits  COMPREHENSIVE METABOLIC PANEL - Abnormal; Notable for the following:    Sodium 134 (*)    Potassium >7.5 (*)    Chloride 93 (*)    CO2 10 (*)    Glucose, Bld 148 (*)    BUN 59 (*)    Creatinine, Ser 7.27 (*)    Albumin 2.9 (*)    AST 716 (*)    ALT 320 (*)    Total Bilirubin 1.7 (*)    GFR calc non Af Amer 5 (*)    GFR calc Af Amer 6 (*)    Anion gap 31 (*)    All other components within normal limits  BRAIN NATRIURETIC PEPTIDE - Abnormal; Notable for the following:    B Natriuretic Peptide >4500.0 (*)    All other components within normal limits  GLUCOSE, CAPILLARY - Abnormal; Notable for the following:    Glucose-Capillary 24 (*)    All other components within normal limits  I-STAT TROPOININ, ED - Abnormal; Notable for the following:    Troponin i, poc >35.00 (*)    All other components within normal limits  I-STAT CHEM 8, ED - Abnormal; Notable for the following:    Sodium 130 (*)    Potassium 7.0 (*)    Chloride 100 (*)    BUN 57 (*)    Creatinine, Ser 6.90 (*)    Calcium, Ion 1.09 (*)    All other components within normal limits  I-STAT CHEM 8, ED - Abnormal; Notable for the following:    Sodium 129 (*)    Potassium 7.4 (*)    Chloride 100 (*)    BUN 58 (*)    Creatinine, Ser 7.20 (*)    Glucose, Bld 141 (*)    Calcium, Ion 1.06 (*)    All other components within normal limits  POCT I-STAT, CHEM 8 - Abnormal; Notable for the following:    Sodium 128 (*)    Potassium 5.9 (*)    Chloride 98 (*)    BUN 56 (*)    Creatinine, Ser 6.40 (*)    Glucose, Bld 499 (*)    Calcium, Ion  1.31 (*)    Hemoglobin 11.9 (*)    HCT 35.0 (*)    All other components within normal limits  MRSA PCR SCREENING  GLUCOSE, CAPILLARY  CBC  COMPREHENSIVE METABOLIC PANEL  TROPONIN I  TROPONIN I  TROPONIN I  CBC WITH DIFFERENTIAL/PLATELET  HEMOGLOBIN A1C  HEPATITIS B SURFACE ANTIGEN  CBG MONITORING, ED  POCT ACTIVATED CLOTTING TIME    Imaging Review Dg Chest Portable 1 View  03-21-2015  CLINICAL DATA:  Post stemi EXAM: PORTABLE CHEST - 1 VIEW COMPARISON:  09/04/2014 FINDINGS: Patient intubated, endotracheal tube tip approximately 5 cm above carina. The nasogastric tube extends at least as far as the stomach, tip not seen. Tunneled left IJ hemodialysis catheter extends to the proximal right atrium. Stable mild cardiomegaly. Atheromatous aorta. Left infrahilar subsegmental atelectasis or ainfiltrate. Right lung clear. No pneumothorax. No effusion. Visualized skeletal structures are unremarkable. IMPRESSION: 1. Left infrahilar infiltrate or atelectasis. 2. Stable mild cardiomegaly 3. Support hardware placement as above. Electronically Signed   By: Corlis Leak  Hassell M.D.   On: 03/17/2015 09:28   I have personally reviewed and evaluated these images and lab results as part of my medical decision-making.   EKG Interpretation   Date/Time:  Tuesday March 11 2015 09:12:28 EDT Ventricular Rate:  48 PR Interval:  284 QRS Duration: 154 QT Interval:  584 QTC Calculation: 522 R Axis:   105 Text Interpretation:  Sinus bradycardia ** ** ACUTE MI / STEMI ** **  Abnormal ekg Since last tracing now havingt STEMI Confirmed by [place]  MD,  Khoi Hamberger ([id number]) on 04/04/2015 10:09:29 AM      MDM   Final diagnoses:  ST elevation myocardial infarction (STEMI), unspecified artery (HCC)  Hypoglycemia    Intubated on arrival for severe respiratory distress, wosrening effort and given PR ASA fior STEMI, heprain, and code STEMI activated.  Pt had persistent hypoglycemia and required multiple doses of D 50,  suspected some hyper Kalemia - was given calcium, bicarb (avoided insulin due to hypog), had some improveemnt - Dr. Sharyn LullHarwani took pt to cath lab - critically ill.  Ongoing D 5 drip for recurrent hypoclygemic to 20 after arrival.  INTUBATION Performed by: Vida RollerMILLER,Murice Barbar D  Required items: required blood products, implants, devices, and special equipment available Patient identity confirmed: provided demographic data and hospital-assigned identification number Time out: Immediately prior to procedure a "time out" was called to verify the correct patient, procedure, equipment, support staff and site/side marked as required.  Indications: respiratory distress  Intubation method: Direct Laryngoscopy   Preoxygenation: BVM  Sedatives: 20  Etomidate Paralytic: 100 Rocuronium  Tube Size: 7.5 cuffed  Post-procedure assessment: chest rise and ETCO2 monitor Breath sounds: equal and absent over the epigastrium Tube secured with: ETT holder Chest x-ray interpreted by radiologist and me.  Chest x-ray findings: endotracheal tube in appropriate position  Patient tolerated the procedure well with no immediate complications.   CRITICAL CARE Performed by: Vida RollerMILLER,Blinda Turek D Total critical care time: 35 minutes Critical care time was exclusive of separately billable procedures and treating other patients. Critical care was necessary to treat or prevent imminent or life-threatening deterioration. Critical care was time spent personally by me on the following activities: development of treatment plan with patient and/or surrogate as well as nursing, discussions with consultants, evaluation of patient's response to treatment, examination of patient, obtaining history from patient or surrogate, ordering and performing treatments and interventions, ordering and review of laboratory studies, ordering and review of radiographic studies, pulse oximetry and re-evaluation of patient's condition.     Eber HongBrian  Sophiamarie Nease, MD 03/24/2015 [phone]

## 2015-03-11 NOTE — Progress Notes (Signed)
Vent settings changed per MD order on RR of 12, and RT decreased FIO2 to 50% based on ABG results.

## 2015-03-11 NOTE — Consult Note (Signed)
PULMONARY / CRITICAL CARE MEDICINE   Name: Yolanda Rush MRN: [id number] DOB: 04/04/1951    ADMISSION DATE:  03/25/2015 CONSULTATION DATE:  03/23/2015  REFERRING MD :  Dr. [name]   CHIEF COMPLAINT:  AMS, STEMI, N/V, Hyperkalemia   INITIAL PRESENTATION: 64 y/o F with PMH of DM, CKD and HTN who presented to [facility] on 11/1 after being found by family altered and confused.  EMS found the patient to have hypoglycemia (CBG 20), bradycardia, and hypotension.  She was intubated in the ER.  EKG concerning for STEMI and patient taken to the cath lab.    STUDIES:  11/01  LHC >> stent x2 to RCA  SIGNIFICANT EVENTS: 11/01  Admit with AMS, hypoglycemia, bradycardia, hypotension.  STEMI >> LHC s/p stent x2 to RCA.     HISTORY OF PRESENT ILLNESS:  64 y/o F with PMH of DM, CKD [facility], T/Th/S) and HTN who presented to [facility] on 11/1 after being found by family altered and confused.    The family reported the patient had nausea / vomiting the evening prior to admit.  She also complained of chest pain.  On 11/1 am they found her altered / unable to wake her and activated EMS.  Evaluation found the patient to have hypoglycemia (CBG 20), bradycardia, and hypotension.  Her husband reports he draws up her insulin and administers.  She received her usual dose the evening prior to admit.  Glucose at that time was 204.  She was intubated in the ER.  ER notes reflect she was unresponsive.  EKG concerning for STEMI and patient taken to the cath lab. Initial labs:  Na 128, K 7.4, cl 98, sr cr 6.40 and glucose 96, hgb 11.9, hct 35.  CXR was negative for acute process.  Post cath lab, the patient was returned to ICU on mechanical ventilation.  PCCM consulted for evaluation / medical management.    Family reports she previously had a AV fistula that she had multiple problems with requiring frequent declotting.  The patient has refused new placement of AVF and has wanted to continue with her perm cath.  They also indicate she  eats / drinks what she wants despite family attempting to limit to a healthy diet.    PAST MEDICAL HISTORY :   has a past medical history of Diabetes mellitus without complication (HCC); Renal disorder; and Hypertension.  has no past surgical history on file.   Prior to Admission medications   Medication Sig Start Date End Date Taking? Authorizing Provider  Acetaminophen 500 MG TBDP Take 1 tablet by mouth as needed. Equate Brand    Historical Provider, MD  calcium acetate (PHOSLO) 667 MG capsule     Historical Provider, MD  DiphenhydrAMINE HCl (ALLERGY MED PO) Take 10 mg by mouth as needed. Equate Brand    Historical Provider, MD  glucose blood (ONE TOUCH ULTRA TEST) test strip Use to test blood sugar 3 times daily as instructed. Dx: E11.22 12/23/14   [name], MD  HYDROcodone-acetaminophen (NORCO/VICODIN) 5-325 MG per tablet Take by mouth. 01/06/15   Historical Provider, MD  Insulin Glargine (LANTUS SOLOSTAR) 100 UNIT/ML Solostar Pen Inject 12 Units into the skin daily at 10 pm. Patient not taking: Reported on 02/28/2015 02/03/15   [name], MD  Insulin Pen Needle (CAREFINE PEN NEEDLES) 32G X 4 MM MISC Use 1x a dau Patient not taking: Reported on 02/28/2015 12/13/14   [name], MD  insulin regular (HUMULIN R) 100 units/mL injection Inject 0.04-0.05  mLs (4-5 Units total) into the skin 3 (three) times daily before meals. ReliOn insulin Patient not taking: Reported on 02/28/2015 02/03/15   Carlus Pavlovristina Gherghe, MD  Insulin Syringe-Needle U-100 (B-D INS SYRINGE 0.5CC/30GX1/2") 30G X 1/2" 0.5 ML MISC Use 3x a day Patient not taking: Reported on 02/28/2015 02/03/15   Carlus Pavlovristina Gherghe, MD  lanthanum (FOSRENOL) 1000 MG chewable tablet Chew 1,000 mg by mouth 3 (three) times daily with meals.    Historical Provider, MD  metoprolol tartrate (LOPRESSOR) 25 MG tablet  07/06/14   Historical Provider, MD  multivitamin (RENA-VIT) TABS tablet Take 1 tablet by mouth daily.    Historical Provider,  MD  [facility] LANC[facility] FINE MISC Use to test blood sugar 3 times daily. Dx:E11.22 12/23/14   Carlus Pavlovristina Gherghe, MD  saxagliptin HCl (ONGLYZA) 5 MG TABS tablet Take 5 mg by mouth daily.    Historical Provider, MD   No Known Allergies  FAMILY HISTORY:  indicated that her mother is deceased. She indicated that her father is deceased.    SOCIAL HISTORY:  reports that she has never smoked. She has never used smokeless tobacco. She reports that she does not drink alcohol or use illicit drugs.  REVIEW OF SYSTEMS:  Unable to complete as patient is altered on mechanical ventilation.   SUBJECTIVE:   VITAL SIGNS: Pulse Rate:  [30-127] 107 (11/01 1116) Resp:  [16-30] 23 (11/01 1116) BP: (88-180)/(51-113) 174/104 mmHg (11/01 1116) SpO2:  [85 %-100 %] 100 % (11/01 1116) FiO2 (%):  [100 %] 100 % (11/01 0934)   HEMODYNAMICS:     VENTILATOR SETTINGS: Vent Mode:  [-] PRVC FiO2 (%):  [100 %] 100 % Set Rate:  [18 bmp] 18 bmp Vt Set:  [400 mL] 400 mL PEEP:  [5 cmH20] 5 cmH20 Plateau Pressure:  [18 cmH20] 18 cmH20   INTAKE / OUTPUT: No intake or output data in the 24 hours ending 11/24/2014 1141  PHYSICAL EXAMINATION: General:  Thin adult female in NAD Neuro:  Obtunded on vent, no corneal response, no response to verbal / painful stimuli  HEENT:  OETT, mm pink/moist, no jvd Cardiovascular:  s1s2 rrr, soft SEM  Lungs:  resp's even/non-labored, lungs bilaterally clear  Abdomen:  Non-distended, soft Musculoskeletal:  No acute deformities  Skin: cool/dry, no edema   LABS:  CBC  Recent Labs Lab 11/24/2014 0904 11/24/2014 0908 11/24/2014 0914 11/24/2014 0945  WBC 31.8*  --   --   --   HGB 12.0 13.9 13.6 11.9*  HCT 37.5 41.0 40.0 35.0*  PLT 80*  --   --   --    Coag's No results for input(s): APTT, INR in the last 168 hours. BMET  Recent Labs Lab 11/24/2014 0904 11/24/2014 0908 11/24/2014 0914 11/24/2014 0945  NA 134* 130* 129* 128*  K >7.5* 7.0* 7.4* 5.9*  CL 93* 100* 100* 98*  CO2  10*  --   --   --   BUN 59* 57* 58* 56*  CREATININE 7.27* 6.90* 7.20* 6.40*  GLUCOSE 148* 96 141* 499*   Electrolytes  Recent Labs Lab 11/24/2014 0904  CALCIUM 9.9   Sepsis Markers No results for input(s): LATICACIDVEN, PROCALCITON, O2SATVEN in the last 168 hours. ABG No results for input(s): PHART, PCO2ART, PO2ART in the last 168 hours. Liver Enzymes  Recent Labs Lab 11/24/2014 0904  AST 716*  ALT 320*  ALKPHOS 85  BILITOT 1.7*  ALBUMIN 2.9*   Cardiac Enzymes No results for input(s): TROPONINI, PROBNP in the last 168 hours.  Glucose  Recent Labs Lab 03/17/2015 0855 2015/03/17 0909  GLUCAP 73 24*    Imaging Dg Chest Portable 1 View  Mar 17, 2015  CLINICAL DATA:  Post stemi EXAM: PORTABLE CHEST - 1 VIEW COMPARISON:  09/04/2014 FINDINGS: Patient intubated, endotracheal tube tip approximately 5 cm above carina. The nasogastric tube extends at least as far as the stomach, tip not seen. Tunneled left IJ hemodialysis catheter extends to the proximal right atrium. Stable mild cardiomegaly. Atheromatous aorta. Left infrahilar subsegmental atelectasis or ainfiltrate. Right lung clear. No pneumothorax. No effusion. Visualized skeletal structures are unremarkable. IMPRESSION: 1. Left infrahilar infiltrate or atelectasis. 2. Stable mild cardiomegaly 3. Support hardware placement as above. Electronically Signed   By: Corlis Leak M.D.   On: 2015-03-17 09:28     ASSESSMENT / PLAN:  PULMONARY OETT 11/1 >>  A: Acute Respiratory Failure - in setting of STEMI, hypoglycemia, hypotension  At Risk Aspiration - nausea / vomiting, L infrahilar atx on admit CXR   P:   PRVC 8 cc/kg  Wean PEEP / FiO2 for sats > 92% Intermittent CXR  PRN albuterol  Follow up ABG  CTA chest to r/o PE  CARDIOVASCULAR CVL A:  STEMI - s/p stent x2 to RCA  Bradycardia  Hypotension  HTN P:  Anti-platelets per Cardiology  Dr. [name] following  ASA  Lipitor  RENAL LIJ Permcath HD >>  A:   CKD - [facility]ThS at  [facility] Hyponatremia  Hyperkalemia P:   Nephrology consult Trend BMP  Replace electrolytes as indicated   GASTROINTESTINAL A:   Nausea / Vomiting  Coffee Ground Emesis - r/o GIB  P:   NPO  Place OGT  Consider TF in am 11/2 if remains intubated  Monitor for bleeding   HEMATOLOGIC A:   Mild Anemia  P:  Trend CBC  DVT Prophylaxis: SCD's / post cath 11/1  Transfuse per ICU guidelines  INFECTIOUS A:   At Risk of Aspiration - given hx of n/v  P:   Monitor fever curve / WBC   ENDOCRINE A:   Hyperglycemia  DM   P:   ICU hyperglycemia protocol   NEUROLOGIC A:   Acute Encephalopathy - in setting of hypoglycemia, bradycardia, hypotension  P:   RASS goal: -2 CT Head to r/o acute process  PRN fentanyl only  Serial neuro exams    FAMILY  - Updates: Husband / daughter in law updated at bedside per Dr. Vassie Loll  - Inter-disciplinary family meet or Palliative Care meeting due by:  11/8     Canary Brim, NP-C [facility] Pgr: [phone] or if no answer [phone] 03-17-15, 11:42 AM

## 2015-03-11 NOTE — ED Notes (Signed)
Chaplain escorted family to cath lab waiting area

## 2015-03-11 NOTE — Progress Notes (Signed)
eLink Physician-Brief Progress Note Patient Name: Franco NonesLilaben Rennaker DOB: 03-16-1951 MRN: [id number]   Date of Service  03/24/2015  HPI/Events of Note  New patient evaluation, chart reviewed.  Nothing to add.  eICU Interventions       Intervention Category Major Interventions: Other:  YACOUB,WESAM 03/18/2015, 7:36 PM

## 2015-03-11 NOTE — Progress Notes (Signed)
Subjective:  Called to see patient as she became hypotensive with EKG changes more prominent ST elevation in inferior leads and ST depression in posterior leads as compared to prior EKG. Patient remains unresponsive and intubated. Discussed with patient's son regarding the local angiogram this risk and benefits and consents to procedure.  Objective:  Vital Signs in the last 24 hours: Temp:  [92.1 F (33.4 C)-102.5 F (39.2 C)] 102.5 F (39.2 C) (11/01 2046) Pulse Rate:  [30-127] 72 (11/01 2303) Resp:  [12-37] 19 (11/01 2303) BP: (64-180)/(37-113) 91/37 mmHg (11/01 2303) SpO2:  [74 %-100 %] 100 % (11/01 2337) Arterial Line BP: (30-184)/(17-83) 130/49 mmHg (11/01 2210) FiO2 (%):  [50 %-100 %] 50 % (11/01 2303) Weight:  [59.7 kg (131 lb 9.8 oz)] 59.7 kg (131 lb 9.8 oz) (11/01 1820)  Intake/Output from previous day:   Intake/Output from this shift: Total I/O In: 127.8 [I.V.:127.8] Out: -   Physical Exam: Neck: no adenopathy, no carotid bruit, no JVD and supple, symmetrical, trachea midline Lungs: clear anteriorly Heart: regular rate and rhythm, S1, S2 normal and soft systolic murmur noted Abdomen: soft, non-tender; bowel sounds normal; no masses,  no organomegaly Extremities: extremities normal, atraumatic, no cyanosis or edema and right groin stable  Lab Results:  Recent Labs  January 08, 2015 1345 January 08, 2015 2000  WBC 22.5* 14.9*  HGB 12.9 10.0*  PLT 81* 91*    Recent Labs  January 08, 2015 0904  January 08, 2015 1137 January 08, 2015 1345  NA 134*  < > 129* 136  K >7.5*  < > 6.5* 4.6  CL 93*  < > 97* 92*  CO2 10*  --   --  22  GLUCOSE 148*  < > 414* 220*  BUN 59*  < > 52* 29*  CREATININE 7.27*  < > 6.30* 3.79*  < > = values in this interval not displayed.  Recent Labs  January 08, 2015 1345  TROPONINI >65.00*   Hepatic Function Panel  Recent Labs  January 08, 2015 0904  PROT 6.9  ALBUMIN 2.9*  AST 716*  ALT 320*  ALKPHOS 85  BILITOT 1.7*   No results for input(s): CHOL in the last 72 hours. No  results for input(s): PROTIME in the last 72 hours.  Imaging: Imaging results have been reviewed and Ct Head Wo Contrast  03/23/2015  CLINICAL DATA:  64 year old with acute encephalopathy. Patient was found unresponsive earlier today with a blood glucose level of 20. Patient hypertensive and bradycardic upon arrival of EMS. Current history of end-stage renal disease on hemodialysis. EXAM: CT HEAD WITHOUT CONTRAST TECHNIQUE: Contiguous axial images were obtained from the base of the skull through the vertex without intravenous contrast. COMPARISON:  06/09/2005. FINDINGS: Moderate cortical and deep atrophy, progressive since 2007. Moderate changes of small vessel disease of the white matter diffusely, also progressive. No mass lesion. No midline shift. No acute hemorrhage or hematoma. No extra-axial fluid collections. No evidence of acute infarction. No skull fracture or other focal osseous abnormality involving the skull. Visualized paranasal sinuses, bilateral mastoid air cells and bilateral middle ear cavities well-aerated. Severe bilateral carotid siphon and vertebral artery atherosclerosis. IMPRESSION: 1. No acute intracranial abnormality. 2. Moderate generalized atrophy and moderate chronic microvascular ischemic changes of the white matter, progressive since 2007. Electronically Signed   By: Hulan Saashomas  Lawrence M.D.   On: 26-Feb-2015 18:28   Dg Chest Portable 1 View  03/15/2015  CLINICAL DATA:  Post stemi EXAM: PORTABLE CHEST - 1 VIEW COMPARISON:  09/04/2014 FINDINGS: Patient intubated, endotracheal tube tip approximately 5 cm above  carina. The nasogastric tube extends at least as far as the stomach, tip not seen. Tunneled left IJ hemodialysis catheter extends to the proximal right atrium. Stable mild cardiomegaly. Atheromatous aorta. Left infrahilar subsegmental atelectasis or ainfiltrate. Right lung clear. No pneumothorax. No effusion. Visualized skeletal structures are unremarkable. IMPRESSION: 1. Left  infrahilar infiltrate or atelectasis. 2. Stable mild cardiomegaly 3. Support hardware placement as above. Electronically Signed   By: Corlis Leak M.D.   On: Mar 16, 2015 09:28    Cardiac Studies:  Assessment/Plan:  Acute inferoposterior wall myocardial infarction status post PCI to RCA with new EKG changes rule out acute occlusion Multivessel CAD Status post hypoglycemic shock Probable hypoglycemic encephalopathy Acute respiratory failure Status post hypokalemia Hypertension Diabetes mellitus End-stage renal disease on hemodialysis Hyperlipidemia Plan Discussed with patient's son at length regarding renal angiogram and agrees for the procedure understands the risks and benefits and consents for the procedure  LOS: 0 days    Rinaldo Cloud 03/16/2015, 11:52 PM

## 2015-03-11 NOTE — ED Notes (Signed)
Drivers license given to family.

## 2015-03-11 NOTE — ED Notes (Signed)
Heparin gtt started 4000U bolus, 800u/hour. In right EJ

## 2015-03-11 NOTE — Progress Notes (Signed)
eLink Physician-Brief Progress Note Patient Name: Yolanda NonesLilaben Rush DOB: [birth year] MRN: [id number]   Date of Service  03/25/2015  HPI/Events of Note  Hypoglycemic  eICU Interventions  D5 1/2 NS at 100 ml/hr.     Intervention Category Major Interventions: Other:  [name] 03/28/2015, 8:25 PM

## 2015-03-11 NOTE — Progress Notes (Signed)
RT assisted transport or patient to CT with RNs. Patient placed on 100% FIO2 for transport until returned to room 2H04. Vitals remained stable throughout.

## 2015-03-11 NOTE — Procedures (Signed)
I was present at this dialysis session. I have reviewed the session itself and made appropriate changes.   [name] [name]  MD 03/25/2015, 2:15 PM

## 2015-03-11 NOTE — Progress Notes (Signed)
Nacl bolus 500cc given by dialysis nurse for low b/p

## 2015-03-11 NOTE — ED Notes (Addendum)
To cath lab per dr. Sharyn LullHarwani.

## 2015-03-11 NOTE — Progress Notes (Signed)
eLink Physician-Brief Progress Note Patient Name: Yolanda Rush DOB: August 21, 1950 MRN: [id number]   Date of Service  03/12/2015  HPI/Events of Note  Hypotensive all of a sudden for no known reason.  Pupils still reactive and per RN the neuro exam has not changed.  eICU Interventions  IVF and dopamine ordered.  Will send NP to evaluate bedside.     Intervention Category Major Interventions: Other:  YACOUB,WESAM 03/17/2015, 9:54 PM

## 2015-03-11 NOTE — Progress Notes (Signed)
Subjective:  Patient remains intubated unresponsive to verbal commands. Appreciate CCM and renal service consult and help  Objective:  Vital Signs in the last 24 hours: Temp:  [92.1 F (33.4 C)-94.7 F (34.8 C)] 94.7 F (34.8 C) (11/01 1545) Pulse Rate:  [30-127] 73 (11/01 1654) Resp:  [16-30] 30 (11/01 1540) BP: (84-180)/(40-113) 136/64 mmHg (11/01 1700) SpO2:  [85 %-100 %] 100 % (11/01 1315) Arterial Line BP: (179-184)/(74-83) 179/79 mmHg (11/01 1250) FiO2 (%):  [50 %-100 %] 50 % (11/01 1708)  Intake/Output from previous day:   Intake/Output from this shift: Total I/O In: 160.9 [I.V.:0.9; NG/GT:60; IV Piggyback:100] Out: -400   Physical Exam: Neck: no adenopathy, no carotid bruit, no JVD and supple, symmetrical, trachea midline Lungs: Clear to auscultation anterolaterally Heart: regular rate and rhythm, S1, S2 normal and Soft systolic murmur noted Abdomen: soft, non-tender; bowel sounds normal; no masses,  no organomegaly Extremities: extremities normal, atraumatic, no cyanosis or edema and Right groin sheath site dry no hematoma  Lab Results:  Recent Labs  05-12-14 0904  05-12-14 1137 05-12-14 1345  WBC 31.8*  --   --  22.5*  HGB 12.0  < > 11.6* 12.9  PLT 80*  --   --  81*  < > = values in this interval not displayed.  Recent Labs  05-12-14 0904  05-12-14 1137 05-12-14 1345  NA 134*  < > 129* 136  K >7.5*  < > 6.5* 4.6  CL 93*  < > 97* 92*  CO2 10*  --   --  22  GLUCOSE 148*  < > 414* 220*  BUN 59*  < > 52* 29*  CREATININE 7.27*  < > 6.30* 3.79*  < > = values in this interval not displayed.  Recent Labs  05-12-14 1345  TROPONINI >65.00*   Hepatic Function Panel  Recent Labs  05-12-14 0904  PROT 6.9  ALBUMIN 2.9*  AST 716*  ALT 320*  ALKPHOS 85  BILITOT 1.7*   No results for input(s): CHOL in the last 72 hours. No results for input(s): PROTIME in the last 72 hours.  Imaging: Imaging results have been reviewed and Dg Chest Portable 1  View  03/22/2015  CLINICAL DATA:  Post stemi EXAM: PORTABLE CHEST - 1 VIEW COMPARISON:  09/04/2014 FINDINGS: Patient intubated, endotracheal tube tip approximately 5 cm above carina. The nasogastric tube extends at least as far as the stomach, tip not seen. Tunneled left IJ hemodialysis catheter extends to the proximal right atrium. Stable mild cardiomegaly. Atheromatous aorta. Left infrahilar subsegmental atelectasis or ainfiltrate. Right lung clear. No pneumothorax. No effusion. Visualized skeletal structures are unremarkable. IMPRESSION: 1. Left infrahilar infiltrate or atelectasis. 2. Stable mild cardiomegaly 3. Support hardware placement as above. Electronically Signed   By: Corlis Leak  Hassell M.D.   On: Dec 25, 2014 09:28    Cardiac Studies:  Assessment/Plan:  Acute inferoposterior wall myocardial infarction status post PCI to RCA with excellent angiographic results Multivessel CAD Status post hypoglycemic shock Probable hypoglycemic encephalopathy Acute respiratory failure Status post hypokalemia Hypertension Diabetes mellitus End-stage renal disease on hemodialysis Hyperlipidemia Plan Continue present management Check labs in a.m.  LOS: 0 days    Yolanda CloudHarwani, Yolanda Rush 03/16/2015, 5:36 PM

## 2015-03-11 NOTE — Progress Notes (Signed)
ANTICOAGULATION CONSULT NOTE - Initial Consult  Pharmacy Consult for Heparin Indication: chest pain/ACS  No Known Allergies  Patient Measurements: Height:  (157.5 cm) Weight: 131 lb 9.8 oz (59.7 kg) IBW/kg (Calculated) : 50.1 Heparin Dosing Weight: 60 kg  Vital Signs: Temp: 102.5 F (39.2 C) (11/01 2046) Temp Source: Axillary (11/01 2046) BP: 93/61 mmHg (11/01 2100) Pulse Rate: 74 (11/01 2100)  Labs:  Recent Labs  03/12/2015 0904  03/26/2015 0945 03/24/2015 1137 03/22/2015 1345 03/24/2015 2000  HGB 12.0  < > 11.9* 11.6* 12.9 10.0*  HCT 37.5  < > 35.0* 34.0* 38.8 30.9*  PLT 80*  --   --   --  81* 91*  CREATININE 7.27*  < > 6.40* 6.30* 3.79*  --   TROPONINI  --   --   --   --  >65.00*  --   < > = values in this interval not displayed.  Estimated Creatinine Clearance: 11.7 mL/min (by C-G formula based on Cr of 3.79).   Medical History: Past Medical History  Diagnosis Date  . Diabetes mellitus without complication (HCC)   . Renal disorder   . Hypertension     Medications:  Prescriptions prior to admission  Medication Sig Dispense Refill Last Dose  . metoprolol tartrate (LOPRESSOR) 25 MG tablet    Past Month at   . Acetaminophen 500 MG TBDP Take 1 tablet by mouth as needed. Equate Brand   Taking  . calcium acetate (PHOSLO) 667 MG capsule    Taking  . DiphenhydrAMINE HCl (ALLERGY MED PO) Take 10 mg by mouth as needed. Equate Brand   Taking  . glucose blood (ONE TOUCH ULTRA TEST) test strip Use to test blood sugar 3 times daily as instructed. Dx: E11.22 100 each 3 Taking  . HYDROcodone-acetaminophen (NORCO/VICODIN) 5-325 MG per tablet Take by mouth.   Taking  . Insulin Glargine (LANTUS SOLOSTAR) 100 UNIT/ML Solostar Pen Inject 12 Units into the skin daily at 10 pm. (Patient not taking: Reported on 02/28/2015) 5 pen 2 Not Taking  . Insulin Pen Needle (CAREFINE PEN NEEDLES) 32G X 4 MM MISC Use 1x a dau (Patient not taking: Reported on 02/28/2015) 100 each 2 Not  Taking  . insulin regular (HUMULIN R) 100 units/mL injection Inject 0.04-0.05 mLs (4-5 Units total) into the skin 3 (three) times daily before meals. ReliOn insulin (Patient not taking: Reported on 02/28/2015) 10 mL 2 Not Taking  . Insulin Syringe-Needle U-100 (B-D INS SYRINGE 0.5CC/30GX1/2") 30G X 1/2" 0.5 ML MISC Use 3x a day (Patient not taking: Reported on 02/28/2015) 100 each 11 Not Taking  . lanthanum (FOSRENOL) 1000 MG chewable tablet Chew 1,000 mg by mouth 3 (three) times daily with meals.   Taking  . multivitamin (RENA-VIT) TABS tablet Take 1 tablet by mouth daily.   Taking  . ONETOUCH DELICA LANCETS FINE MISC Use to test blood sugar 3 times daily. Dx:E11.22 100 each 3 Taking  . saxagliptin HCl (ONGLYZA) 5 MG TABS tablet Take 5 mg by mouth daily.   Taking   Scheduled:  . [START ON 03/12/2015] antiseptic oral rinse  7 mL Mouth Rinse QID  . aspirin  324 mg Oral Once  . [START ON 03/12/2015] aspirin EC  81 mg Oral Daily  . atorvastatin  40 mg Oral q1800  . calcium chloride  1 g Intravenous Once  . chlorhexidine gluconate  15 mL Mouth Rinse BID  . heparin      . insulin aspart  0-9 Units Subcutaneous  TID WC  . insulin aspart  4 Units Subcutaneous TID WC  . metoprolol tartrate  12.5 mg Oral BID  . multivitamin  1 tablet Oral QHS  . nitroGLYCERIN      . [START ON 03/15/2015] pantoprazole (PROTONIX) IV  40 mg Intravenous Q12H  . sodium bicarbonate  50 mEq Intravenous Once  . sodium chloride  250 mL Intravenous Once  . sodium chloride  500 mL Intravenous Once  . sodium chloride  3 mL Intravenous Q12H  . sodium chloride  3 mL Intravenous Q12H  . [START ON 03/12/2015] ticagrelor  90 mg Oral BID   Infusions:  . dextrose 5 % and 0.45% NaCl 100 mL/hr at 2015/03/27 2031  . DOPamine    . nitroGLYCERIN Stopped (2015/03/27 2120)  . norepinephrine (LEVOPHED) Adult infusion    . pantoprozole (PROTONIX) infusion 8 mg/hr (2015/03/27 1739)  . tirofiban Stopped (2015/03/27 1445)    Assessment: 63yo  female here for STEMI s/p PCI to RCA. Pharmacy is consulted to dose heparin for ACS/chest pain. Hgb 10.0, Plt 91, sCr 3.79 and patient is on tirofiban.  Goal of Therapy:  Heparin level 0.3-0.5 units/ml Monitor platelets by anticoagulation protocol: Yes   Plan:  Give 4000 units bolus x 1 Start heparin infusion at 700 units/hr Check anti-Xa level in 8 hours and daily while on heparin Continue to monitor H&H and platelets  Arlean Hoppingorey M. Newman PiesBall, PharmD Clinical Pharmacist Pager [phone] 03/25/2015,10:24 PM

## 2015-03-11 NOTE — Progress Notes (Addendum)
CRITICAL VALUE ALERT  Critical value received:  Troponin   Date of notification:  03/30/2015  Time of notification:  1345  Critical value read back: yes  Nurse who received alert:  Noah CharonPenny Tishie Altmann RN  MD notified (1st page):  Expected value  Time of first page:    MD notified (2nd page):  Time of second page:  Responding MD:  Dr Sharyn LullHarwani  Time MD responded:

## 2015-03-11 NOTE — H&P (Signed)
Yolanda Rush is an 64 y.o. female.   Chief Complaint: See unresponsiveness/code STEMI HPI: Patient is 64 year old female with past mental history significant for insulin-requiring diabetes mellitus, hypertension, hyperlipidemia, end-stage renal disease on hemodialysis, was found unresponsive by family around 6 AM and was noted to have blood sugar of 20 patient was noted to be bradycardic and hypotensive EKG done in the ED showed sinus bradycardia with ST elevation in inferior leads with rest protocol ST depression in lead 1 and aVL and also history depression in V2 to V5 suggestive of inferoposterior wall myocardial infarction. Patient was also noted to have troponin of about 35 and was noted to be hyperkalemic with hypertension number of 7. Patient received IV Ca chloride , sodium bicarbonate and D50 and was brought emergently to the cardiac cath lab. As per husband patient was doing fine until last night had episodes of nausea and vomiting and went to bed this morning could not wake her up so called his son and then called paramedics and was brought to the ED. Patient was unresponsive in the ED diaphoretic and was intubated prior to going to cath lab. Her dialysis days are Tuesday Thursday and Saturday. Past Medical History  Diagnosis Date  . Diabetes mellitus without complication ([place])   . Renal disorder   . Hypertension     History reviewed. No pertinent past surgical history.  Family History  Problem Relation Age of Onset  . Diabetes Mother   . Hyperlipidemia Father    Social History:  reports that she has never smoked. She has never used smokeless tobacco. She reports that she does not drink alcohol or use illicit drugs.  Allergies: No Known Allergies  Medications Prior to Admission  Medication Sig Dispense Refill  . Acetaminophen 500 MG TBDP Take 1 tablet by mouth as needed. Equate Brand    . calcium acetate (PHOSLO) 667 MG capsule     . DiphenhydrAMINE HCl (ALLERGY MED PO) Take 10  mg by mouth as needed. Equate Brand    . glucose blood (ONE TOUCH ULTRA TEST) test strip Use to test blood sugar 3 times daily as instructed. Dx: E11.22 100 each 3  . HYDROcodone-acetaminophen (NORCO/VICODIN) 5-325 MG per tablet Take by mouth.    . Insulin Glargine (LANTUS SOLOSTAR) 100 UNIT/ML Solostar Pen Inject 12 Units into the skin daily at 10 pm. (Patient not taking: Reported on 02/28/2015) 5 pen 2  . Insulin Pen Needle (CAREFINE PEN NEEDLES) 32G X 4 MM MISC Use 1x a dau (Patient not taking: Reported on 02/28/2015) 100 each 2  . insulin regular (HUMULIN R) 100 units/mL injection Inject 0.04-0.05 mLs (4-5 Units total) into the skin 3 (three) times daily before meals. ReliOn insulin (Patient not taking: Reported on 02/28/2015) 10 mL 2  . Insulin Syringe-Needle U-100 (B-D INS SYRINGE 0.5CC/30GX1/2") 30G X 1/2" 0.5 ML MISC Use 3x a day (Patient not taking: Reported on 02/28/2015) 100 each 11  . lanthanum (FOSRENOL) 1000 MG chewable tablet Chew 1,000 mg by mouth 3 (three) times daily with meals.    . metoprolol tartrate (LOPRESSOR) 25 MG tablet     . multivitamin (RENA-VIT) TABS tablet Take 1 tablet by mouth daily.    Glory Rosebush DELICA LANCETS FINE MISC Use to test blood sugar 3 times daily. Dx:E11.22 100 each 3  . saxagliptin HCl (ONGLYZA) 5 MG TABS tablet Take 5 mg by mouth daily.      Results for orders placed or performed during the hospital encounter of 03/28/2015 (from  the past 48 hour(s))  Glucose, capillary     Status: None   Collection Time: 04/04/2015  8:55 AM  Result Value Ref Range   Glucose-Capillary 73 65 - 99 mg/dL   Comment 1 Notify RN    Comment 2 Document in Chart   CBC with Differential     Status: Abnormal   Collection Time: 04/01/2015  9:04 AM  Result Value Ref Range   WBC 31.8 (H) 4.0 - 10.5 K/uL    Comment: WHITE COUNT CONFIRMED ON SMEAR   RBC 4.20 3.87 - 5.11 MIL/uL   Hemoglobin 12.0 12.0 - 15.0 g/dL   HCT 37.5 36.0 - 46.0 %   MCV 89.3 78.0 - 100.0 fL   MCH 28.6 26.0  - 34.0 pg   MCHC 32.0 30.0 - 36.0 g/dL   RDW 14.9 11.5 - 15.5 %   Platelets 80 (L) 150 - 400 K/uL    Comment: PLATELET COUNT CONFIRMED BY SMEAR REPEATED TO VERIFY    Neutrophils Relative % 86 %   Lymphocytes Relative 6 %   Monocytes Relative 8 %   Eosinophils Relative 0 %   Basophils Relative 0 %   Neutro Abs 27.4 (H) 1.7 - 7.7 K/uL   Lymphs Abs 1.9 0.7 - 4.0 K/uL   Monocytes Absolute 2.5 (H) 0.1 - 1.0 K/uL   Eosinophils Absolute 0.0 0.0 - 0.7 K/uL   Basophils Absolute 0.0 0.0 - 0.1 K/uL   Smear Review MORPHOLOGY UNREMARKABLE   Comprehensive metabolic panel     Status: Abnormal   Collection Time: 03/25/2015  9:04 AM  Result Value Ref Range   Sodium 134 (L) 135 - 145 mmol/L   Potassium >7.5 (HH) 3.5 - 5.1 mmol/L    Comment: CRITICAL RESULT CALLED TO, READ BACK BY AND VERIFIED WITH: WRIGHT A RN 03/25/2015 0952 COSTELLO B    Chloride 93 (L) 101 - 111 mmol/L   CO2 10 (L) 22 - 32 mmol/L   Glucose, Bld 148 (H) 65 - 99 mg/dL   BUN 59 (H) 6 - 20 mg/dL   Creatinine, Ser 7.27 (H) 0.44 - 1.00 mg/dL   Calcium 9.9 8.9 - 10.3 mg/dL   Total Protein 6.9 6.5 - 8.1 g/dL   Albumin 2.9 (L) 3.5 - 5.0 g/dL   AST 716 (H) 15 - 41 U/L   ALT 320 (H) 14 - 54 U/L   Alkaline Phosphatase 85 38 - 126 U/L   Total Bilirubin 1.7 (H) 0.3 - 1.2 mg/dL   GFR calc non Af Amer 5 (L) >60 mL/min   GFR calc Af Amer 6 (L) >60 mL/min    Comment: (NOTE) The eGFR has been calculated using the CKD EPI equation. This calculation has not been validated in all clinical situations. eGFR's persistently <60 mL/min signify possible Chronic Kidney Disease.    Anion gap 31 (H) 5 - 15  Brain natriuretic peptide     Status: Abnormal   Collection Time: 03/15/2015  9:04 AM  Result Value Ref Range   B Natriuretic Peptide >4500.0 (H) 0.0 - 100.0 pg/mL  I-stat troponin, ED  (not at [facility], [facility])     Status: Abnormal   Collection Time: 03/17/2015  9:06 AM  Result Value Ref Range   Troponin i, poc >35.00 (HH) 0.00 - 0.08 ng/mL   Comment  NOTIFIED PHYSICIAN    Comment 3            Comment: Due to the release kinetics of cTnI, a negative result within the  first hours of the onset of symptoms does not rule out myocardial infarction with certainty. If myocardial infarction is still suspected, repeat the test at appropriate intervals.   I-Stat Chem 8, ED  (not at [facility], [facility])     Status: Abnormal   Collection Time: 03/31/2015  9:08 AM  Result Value Ref Range   Sodium 130 (L) 135 - 145 mmol/L   Potassium 7.0 (HH) 3.5 - 5.1 mmol/L   Chloride 100 (L) 101 - 111 mmol/L   BUN 57 (H) 6 - 20 mg/dL   Creatinine, Ser 6.90 (H) 0.44 - 1.00 mg/dL   Glucose, Bld 96 65 - 99 mg/dL   Calcium, Ion 1.09 (L) 1.13 - 1.30 mmol/L   TCO2 11 0 - 100 mmol/L   Hemoglobin 13.9 12.0 - 15.0 g/dL   HCT 41.0 36.0 - 46.0 %   Comment NOTIFIED PHYSICIAN   Glucose, capillary     Status: Abnormal   Collection Time: 03/17/2015  9:09 AM  Result Value Ref Range   Glucose-Capillary 24 (LL) 65 - 99 mg/dL   Comment 1 Notify RN    Comment 2 Document in Chart   I-stat chem 8, ed     Status: Abnormal   Collection Time: 04/03/2015  9:14 AM  Result Value Ref Range   Sodium 129 (L) 135 - 145 mmol/L   Potassium 7.4 (HH) 3.5 - 5.1 mmol/L   Chloride 100 (L) 101 - 111 mmol/L   BUN 58 (H) 6 - 20 mg/dL   Creatinine, Ser 7.20 (H) 0.44 - 1.00 mg/dL   Glucose, Bld 141 (H) 65 - 99 mg/dL   Calcium, Ion 1.06 (L) 1.13 - 1.30 mmol/L   TCO2 10 0 - 100 mmol/L   Hemoglobin 13.6 12.0 - 15.0 g/dL   HCT 40.0 36.0 - 46.0 %   Comment NOTIFIED PHYSICIAN   I-STAT, chem 8     Status: Abnormal   Collection Time: 03/27/2015  9:45 AM  Result Value Ref Range   Sodium 128 (L) 135 - 145 mmol/L   Potassium 5.9 (H) 3.5 - 5.1 mmol/L   Chloride 98 (L) 101 - 111 mmol/L   BUN 56 (H) 6 - 20 mg/dL   Creatinine, Ser 6.40 (H) 0.44 - 1.00 mg/dL   Glucose, Bld 499 (H) 65 - 99 mg/dL   Calcium, Ion 1.31 (H) 1.13 - 1.30 mmol/L   TCO2 12 0 - 100 mmol/L   Hemoglobin 11.9 (L) 12.0 - 15.0 g/dL   HCT 35.0  (L) 36.0 - 46.0 %  POCT Activated clotting time     Status: None   Collection Time: 04/07/2015  9:50 AM  Result Value Ref Range   Activated Clotting Time 245 seconds   Dg Chest Portable 1 View  04/02/2015  CLINICAL DATA:  Post stemi EXAM: PORTABLE CHEST - 1 VIEW COMPARISON:  09/04/2014 FINDINGS: Patient intubated, endotracheal tube tip approximately 5 cm above carina. The nasogastric tube extends at least as far as the stomach, tip not seen. Tunneled left IJ hemodialysis catheter extends to the proximal right atrium. Stable mild cardiomegaly. Atheromatous aorta. Left infrahilar subsegmental atelectasis or ainfiltrate. Right lung clear. No pneumothorax. No effusion. Visualized skeletal structures are unremarkable. IMPRESSION: 1. Left infrahilar infiltrate or atelectasis. 2. Stable mild cardiomegaly 3. Support hardware placement as above. Electronically Signed   By: Lucrezia Europe M.D.   On: 04/08/2015 09:28    Review of Systems  Unable to perform ROS: intubated    Blood pressure 174/104, pulse 107, resp.  rate 23, SpO2 100 %. Physical Exam  Eyes: Conjunctivae are normal. Left eye exhibits no discharge. No scleral icterus.  Neck: Normal range of motion. Neck supple. No JVD present.  Cardiovascular: Normal rate and regular rhythm.   Murmur (Soft systolic murmur and S4 gallop noted) heard. Respiratory:  Clear anterolaterally  GI: Soft. Bowel sounds are normal. She exhibits no distension.  Musculoskeletal: She exhibits no edema or tenderness.  Neurological:  Intubated unresponsive     Assessment/Plan Acute inferoposterior wall myocardial infarction with atypical presentation Hypoglycemic shock Acute respiratory failure Hyperkalemia. Hypertension Diabetes mellitus End-stage renal disease on hemodialysis Hyperlipidemia Plan Discussed with family briefly regarding emergency left cath possible PTCA stenting its risk and benefits i.e. death MI stroke need for emergency CABG local vascular  complications etc. and consents for PCI.  Charolette Forward 03/23/2015, 11:48 AM   He was

## 2015-03-11 NOTE — ED Notes (Signed)
Pt placed on translucent pads

## 2015-03-11 NOTE — ED Notes (Signed)
[facility], per report pts family called out d/t the pt being unresponsive, pt initial CBg 20, pt rcvd 1 amp D50 & CBG went to 112, pt reported to be at baseline last night, pt opens eyes on commands, pt hypertensive & bradycardic upon on EMS arrival, pt hypotensive & bradycardic upon arrival to ED, pt moaning upon arrival to ED, pt placed on non rebreather, pt speaks [place], pt scheduled for HD today, HD access in L upper chest

## 2015-03-11 NOTE — Progress Notes (Signed)
   2014-12-29 0941  Clinical Encounter Type  Visited With Family;Health care provider  Visit Type Initial;Code  Referral From Nurse   Chaplain responded to a code STEMI in the ED. Chaplain assisted in getting family to a consultation room and getting them a medical update from the cardiologist. Chaplain offered support, and our support is available as needed.   Alda PonderAdam M Shirlena Brinegar, Chaplain 24-May-2014 9:42 AM

## 2015-03-11 NOTE — Progress Notes (Signed)
ACT 202

## 2015-03-11 NOTE — ED Notes (Signed)
Activated CODE STEMI  @ 9:00A

## 2015-03-11 NOTE — ED Notes (Signed)
Pt intubated successfully with lungs sounds confirmed by Hyacinth MeekerMiller, MD

## 2015-03-11 NOTE — Consult Note (Signed)
Yolanda Rush 03/20/2015 Yolanda Rush Requesting Physician:  [name] MD  Reason for Consult:  ESRD, Hyperkalemia, STEMI, AMS HPI:  76F seen at the request of Dr. [name] for the above issues. She presented to the emergency room this morning from home with altered mental status, profound hypoglycemia. She was found to have an profoundly elevated serum troponin and evidence of ST elevation on her EKG. She was taken emergently to the cardiac catheterization lab where she received PCI therapy. Throughout she has been unresponsive and unable to provide any history.    Potassium was elevated in the sevens throughout her PCI. She received medical therapy. We were called after cardiac catheterization for dialysis support. She has not been responsive to any stimuli since coming out of the catheterization lab.  She receives her dialysis care at [facility]. She uses a right IJ tunnel dialysis catheter. She has been hypothermic, and has had labile blood pressures.   ROS Balance of 12 systems is negative w/ exceptions as above  Outpt HD Orders Unit: [facility] Days: [facility] Time: 4h55min Dialyzer: F180 EDW: 60kg K/Ca: 2/2 Access: RIJ TDC Needle Size: na BFR/DFR: 450/800 UF Proflie: none VDRA: calcitriol 0.16mcg qTx EPO: none recently Heparin: 2000 IU IVB   PMH  Past Medical History  Diagnosis Date  . Diabetes mellitus without complication (HCC)   . Renal disorder   . Hypertension    PSH History reviewed. No pertinent past surgical history. FH  Family History  Problem Relation Age of Onset  . Diabetes Mother   . Hyperlipidemia Father    SH  reports that she has never smoked. She has never used smokeless tobacco. She reports that she does not drink alcohol or use illicit drugs. Allergies No Known Allergies Home medications Prior to Admission medications   Medication Sig Start Date End Date Taking? Authorizing Provider  Acetaminophen 500 MG TBDP Take 1 tablet by mouth as needed. Equate Brand     Historical Provider, MD  calcium acetate (PHOSLO) 667 MG capsule     Historical Provider, MD  DiphenhydrAMINE HCl (ALLERGY MED PO) Take 10 mg by mouth as needed. Equate Brand    Historical Provider, MD  glucose blood (ONE TOUCH ULTRA TEST) test strip Use to test blood sugar 3 times daily as instructed. Dx: E11.22 12/23/14   Yolanda Pavlov, MD  HYDROcodone-acetaminophen (NORCO/VICODIN) 5-325 MG per tablet Take by mouth. 01/06/15   Historical Provider, MD  Insulin Glargine (LANTUS SOLOSTAR) 100 UNIT/ML Solostar Pen Inject 12 Units into the skin daily at 10 pm. Patient not taking: Reported on 02/28/2015 02/03/15   Yolanda Pavlov, MD  Insulin Pen Needle (CAREFINE PEN NEEDLES) 32G X 4 MM MISC Use 1x a dau Patient not taking: Reported on 02/28/2015 12/13/14   Yolanda Pavlov, MD  insulin regular (HUMULIN R) 100 units/mL injection Inject 0.04-0.05 mLs (4-5 Units total) into the skin 3 (three) times daily before meals. ReliOn insulin Patient not taking: Reported on 02/28/2015 02/03/15   Yolanda Pavlov, MD  Insulin Syringe-Needle U-100 (B-D INS SYRINGE 0.5CC/30GX1/2") 30G X 1/2" 0.5 ML MISC Use 3x a day Patient not taking: Reported on 02/28/2015 02/03/15   Yolanda Pavlov, MD  lanthanum (FOSRENOL) 1000 MG chewable tablet Chew 1,000 mg by mouth 3 (three) times daily with meals.    Historical Provider, MD  metoprolol tartrate (LOPRESSOR) 25 MG tablet  07/06/14   Historical Provider, MD  multivitamin (RENA-VIT) TABS tablet Take 1 tablet by mouth daily.    Historical Provider, MD  Dola Argyle  LANCETS FINE MISC Use to test blood sugar 3 times daily. Dx:E11.22 12/23/14   Yolanda Pavlovristina Gherghe, MD  saxagliptin HCl (ONGLYZA) 5 MG TABS tablet Take 5 mg by mouth daily.    Historical Provider, MD    Current Medications Scheduled Meds: . aspirin  324 mg Oral Once  . [START ON 03/12/2015] aspirin EC  81 mg Oral Daily  . atorvastatin  40 mg Oral q1800  . calcium chloride  1 g Intravenous Once  . dextrose  1 ampule  Intravenous Once  . dextrose      . etomidate      . insulin aspart  0-9 Units Subcutaneous TID WC  . insulin aspart  4 Units Subcutaneous TID WC  . lidocaine (cardiac) 100 mg/25ml      . metoprolol tartrate  12.5 mg Oral BID  . multivitamin  1 tablet Oral QHS  . nitroGLYCERIN      . pantoprazole (PROTONIX) IV  80 mg Intravenous Once  . [START ON 03/15/2015] pantoprazole (PROTONIX) IV  40 mg Intravenous Q12H  . rocuronium      . sodium bicarbonate  50 mEq Intravenous Once  . sodium chloride  3 mL Intravenous Q12H  . sodium chloride  3 mL Intravenous Q12H  . succinylcholine      . ticagrelor  180 mg Per Tube Once  . [START ON 03/12/2015] ticagrelor  90 mg Oral BID   Continuous Infusions: . nitroGLYCERIN Stopped (04/17/2015 1334)  . pantoprozole (PROTONIX) infusion    . tirofiban     PRN Meds:.sodium chloride, sodium chloride, sodium chloride, sodium chloride, acetaminophen, albuterol, alteplase, fentaNYL (SUBLIMAZE) injection, lidocaine (PF), lidocaine-prilocaine, nitroGLYCERIN, ondansetron (ZOFRAN) IV, pentafluoroprop-tetrafluoroeth, sodium chloride, sodium chloride  CBC  Recent Labs Lab 04/17/2015 0904 04/17/2015 0908 04/17/2015 0914 04/17/2015 0945  WBC 31.8*  --   --   --   NEUTROABS 27.4*  --   --   --   HGB 12.0 13.9 13.6 11.9*  HCT 37.5 41.0 40.0 35.0*  MCV 89.3  --   --   --   PLT 80*  --   --   --    Basic Metabolic Panel  Recent Labs Lab 04/17/2015 0904 04/17/2015 0908 04/17/2015 0914 04/17/2015 0945  NA 134* 130* 129* 128*  K >7.5* 7.0* 7.4* 5.9*  CL 93* 100* 100* 98*  CO2 10*  --   --   --   GLUCOSE 148* 96 141* 499*  BUN 59* 57* 58* 56*  CREATININE 7.27* 6.90* 7.20* 6.40*  CALCIUM 9.9  --   --   --     Physical Exam  Blood pressure 129/61, pulse 67, temperature 92.1 F (33.4 C), temperature source Rectal, resp. rate 20, SpO2 100 %. GEN: intubated, unresponsive, tongue protruding ENT: NCAT, ETT in place EYES: Eyes close, no response to corenal stimulation CV:  RRR, no rub PULM: coarse bs b/l ABD: soft, quiet SKIN: no rashes/lesiosn EXT:no LEE   A/P 1. [facility]. [facility] this afternoon to treat hyperkalemia via TDC 2. Cont to follow on daily basis 2. AMS/Unresponsive 1. ? Prolonged hypoglycemia  2. CCM following 3. STEMI s/p PCI 04/06/2015 to RCA 4. VDRF: per CCM 5. HTN/Vol: No UF with hypotension righ tnow 6. 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

## 2015-03-12 ENCOUNTER — Inpatient Hospital Stay (HOSPITAL_COMMUNITY): Payer: Medicare Other

## 2015-03-12 DIAGNOSIS — R570 Cardiogenic shock: Secondary | ICD-10-CM

## 2015-03-12 LAB — POCT I-STAT, CHEM 8
BUN: 25 mg/dL — ABNORMAL HIGH (ref 6–20)
BUN: 57 mg/dL — AB (ref 6–20)
CHLORIDE: 100 mmol/L — AB (ref 101–111)
CHLORIDE: 100 mmol/L — AB (ref 101–111)
Calcium, Ion: 1.68 mmol/L — ABNORMAL HIGH (ref 1.13–1.30)
Calcium, Ion: 1.09 mmol/L — ABNORMAL LOW (ref 1.13–1.30)
Creatinine, Ser: 3.3 mg/dL — ABNORMAL HIGH (ref 0.44–1.00)
Creatinine, Ser: 6.9 mg/dL — ABNORMAL HIGH (ref 0.44–1.00)
Glucose, Bld: 116 mg/dL — ABNORMAL HIGH (ref 65–99)
Glucose, Bld: 96 mg/dL (ref 65–99)
HEMATOCRIT: 29 % — AB (ref 36.0–46.0)
HEMATOCRIT: 41 % (ref 36.0–46.0)
Hemoglobin: 9.9 g/dL — ABNORMAL LOW (ref 12.0–15.0)
Hemoglobin: 13.9 g/dL (ref 12.0–15.0)
POTASSIUM: 3.5 mmol/L (ref 3.5–5.1)
Potassium: 7 mmol/L (ref 3.5–5.1)
SODIUM: 134 mmol/L — AB (ref 135–145)
Sodium: 130 mmol/L — ABNORMAL LOW (ref 135–145)
TCO2: 11 mmol/L (ref 0–100)
TCO2: 19 mmol/L (ref 0–100)

## 2015-03-12 LAB — GLUCOSE, CAPILLARY
GLUCOSE-CAPILLARY: 74 mg/dL (ref 65–99)
GLUCOSE-CAPILLARY: 74 mg/dL (ref 65–99)
GLUCOSE-CAPILLARY: 130 mg/dL — AB (ref 65–99)
GLUCOSE-CAPILLARY: 128 mg/dL — AB (ref 65–99)
Glucose-Capillary: 168 mg/dL — ABNORMAL HIGH (ref 65–99)
Glucose-Capillary: 144 mg/dL — ABNORMAL HIGH (ref 65–99)
Glucose-Capillary: 199 mg/dL — ABNORMAL HIGH (ref 65–99)
Glucose-Capillary: 183 mg/dL — ABNORMAL HIGH (ref 65–99)

## 2015-03-12 LAB — POCT I-STAT 3, ART BLOOD GAS (G3+)
Acid-base deficit: 3 mmol/L — ABNORMAL HIGH (ref 0.0–2.0)
BICARBONATE: 18.9 meq/L — AB (ref 20.0–24.0)
O2 Saturation: 100 %
PCO2 ART: 24 mmHg — AB (ref 35.0–45.0)
Patient temperature: 99.3
TCO2: 20 mmol/L (ref 0–100)
pH, Arterial: 7.504 — ABNORMAL HIGH (ref 7.350–7.450)
pO2, Arterial: 149 mmHg — ABNORMAL HIGH (ref 80.0–100.0)

## 2015-03-12 LAB — CBC
HCT: 26.5 % — ABNORMAL LOW (ref 36.0–46.0)
HEMATOCRIT: 25 % — AB (ref 36.0–46.0)
Hemoglobin: 8.2 g/dL — ABNORMAL LOW (ref 12.0–15.0)
Hemoglobin: 8.7 g/dL — ABNORMAL LOW (ref 12.0–15.0)
MCH: 28.3 pg (ref 26.0–34.0)
MCH: 28.5 pg (ref 26.0–34.0)
MCHC: 32.8 g/dL (ref 30.0–36.0)
MCHC: 32.8 g/dL (ref 30.0–36.0)
MCV: 86.3 fL (ref 78.0–100.0)
MCV: 86.8 fL (ref 78.0–100.0)
PLATELETS: 86 10*3/uL — AB (ref 150–400)
Platelets: 81 10*3/uL — ABNORMAL LOW (ref 150–400)
RBC: 2.88 MIL/uL — ABNORMAL LOW (ref 3.87–5.11)
RBC: 3.07 MIL/uL — ABNORMAL LOW (ref 3.87–5.11)
RDW: 15 % (ref 11.5–15.5)
RDW: 14.9 % (ref 11.5–15.5)
WBC: 11.9 10*3/uL — ABNORMAL HIGH (ref 4.0–10.5)
WBC: 14.7 10*3/uL — ABNORMAL HIGH (ref 4.0–10.5)

## 2015-03-12 LAB — BASIC METABOLIC PANEL
Anion gap: 14 (ref 5–15)
BUN: 28 mg/dL — ABNORMAL HIGH (ref 6–20)
CALCIUM: 8 mg/dL — AB (ref 8.9–10.3)
CO2: 17 mmol/L — AB (ref 22–32)
CREATININE: 3.35 mg/dL — AB (ref 0.44–1.00)
Chloride: 101 mmol/L (ref 101–111)
GFR calc Af Amer: 16 mL/min — ABNORMAL LOW (ref >60)
GFR calc non Af Amer: 13 mL/min — ABNORMAL LOW (ref >60)
GLUCOSE: 170 mg/dL — AB (ref 65–99)
Potassium: 2.9 mmol/L — ABNORMAL LOW (ref 3.5–5.1)
Sodium: 132 mmol/L — ABNORMAL LOW (ref 135–145)

## 2015-03-12 LAB — POCT ACTIVATED CLOTTING TIME: ACTIVATED CLOTTING TIME: 190 s

## 2015-03-12 LAB — MAGNESIUM: MAGNESIUM: 1.7 mg/dL (ref 1.7–2.4)

## 2015-03-12 LAB — HEPATITIS B SURFACE ANTIGEN: HEP B S AG: NEGATIVE

## 2015-03-12 LAB — PHOSPHORUS: Phosphorus: 4.8 mg/dL — ABNORMAL HIGH (ref 2.5–4.6)

## 2015-03-12 LAB — TROPONIN I: Troponin I: >65 ng/mL (ref <0.031)

## 2015-03-12 MED ORDER — SODIUM CHLORIDE 0.9 % IJ SOLN: 3.0000 mL | Freq: Two times a day (BID) | INTRAMUSCULAR | Status: DC

## 2015-03-12 MED ORDER — NOREPINEPHRINE BITARTRATE 1 MG/ML IV SOLN
0.0000 ug/min | INTRAVENOUS | Status: DC
  Filled 2015-03-12: qty 16

## 2015-03-12 MED ORDER — MAGNESIUM SULFATE 2 GM/50ML IV SOLN
2.0000 g | Freq: Once | INTRAVENOUS | Status: AC
  Administered 2015-03-12: 2 g via INTRAVENOUS
  Filled 2015-03-12: qty 50

## 2015-03-12 MED ORDER — SODIUM CHLORIDE 0.9 % IJ SOLN: 3.0000 mL | INTRAMUSCULAR | Status: DC | PRN

## 2015-03-12 MED ORDER — PERFLUTREN LIPID MICROSPHERE
INTRAVENOUS | Status: AC
  Administered 2015-03-12: 2 mL
  Filled 2015-03-12: qty 10

## 2015-03-12 MED ORDER — SODIUM CHLORIDE 0.9 % IV SOLN: 250.0000 mL | INTRAVENOUS | Status: DC | PRN

## 2015-03-12 MED ORDER — NOREPINEPHRINE BITARTRATE 1 MG/ML IV SOLN
0.0000 ug/min | INTRAVENOUS | Status: DC
  Administered 2015-03-12: 14 ug/min via INTRAVENOUS
  Administered 2015-03-12: 15 ug/min via INTRAVENOUS
  Administered 2015-03-13 (×2): 14 ug/min via INTRAVENOUS
  Filled 2015-03-12 (×5): qty 4

## 2015-03-12 MED ORDER — POTASSIUM CHLORIDE 10 MEQ/50ML IV SOLN: 10.0000 meq | INTRAVENOUS | Status: DC

## 2015-03-12 MED ORDER — NOREPINEPHRINE BITARTRATE 1 MG/ML IV SOLN: 0.0000 ug/min | INTRAVENOUS | Status: DC

## 2015-03-12 MED ORDER — NOREPINEPHRINE BITARTRATE 1 MG/ML IV SOLN
0.0000 ug/min | INTRAVENOUS | Status: DC
  Administered 2015-03-12: 15 ug/min via INTRAVENOUS
  Administered 2015-03-12: 20 ug/min via INTRAVENOUS
  Filled 2015-03-12 (×2): qty 4

## 2015-03-12 MED ORDER — PANTOPRAZOLE SODIUM 40 MG IV SOLR
40.0000 mg | Freq: Two times a day (BID) | INTRAVENOUS | Status: DC
  Administered 2015-03-12 (×2): 40 mg via INTRAVENOUS
  Filled 2015-03-12 (×2): qty 40

## 2015-03-12 MED ORDER — SODIUM CHLORIDE 0.9 % IV SOLN
3.0000 g | INTRAVENOUS | Status: DC
  Administered 2015-03-12 – 2015-03-13 (×2): 3 g via INTRAVENOUS
  Filled 2015-03-12 (×2): qty 3

## 2015-03-12 MED ORDER — SODIUM CHLORIDE 0.9 % IV SOLN
250.0000 mL | INTRAVENOUS | Status: DC | PRN
Start: 2015-03-12 — End: 2015-03-13

## 2015-03-12 MED ORDER — ASPIRIN 81 MG PO CHEW: 81.0000 mg | CHEWABLE_TABLET | Freq: Every day | ORAL | Status: DC

## 2015-03-12 MED ORDER — POTASSIUM CHLORIDE 10 MEQ/100ML IV SOLN
10.0000 meq | INTRAVENOUS | Status: AC
  Administered 2015-03-12 (×2): 10 meq via INTRAVENOUS
  Filled 2015-03-12 (×2): qty 100

## 2015-03-12 MED FILL — Medication: Qty: 1 | Status: AC

## 2015-03-12 MED FILL — Heparin Sodium (Porcine) 2 Unit/ML in Sodium Chloride 0.9%: INTRAMUSCULAR | Qty: 1500 | Status: AC

## 2015-03-12 NOTE — Consult Note (Signed)
ANTIBIOTIC CONSULT NOTE - INITIAL  Pharmacy Consult for Unasyn Indication: aspiration pneumonia  No Known Allergies  Patient Measurements: Height: 5\' 2"  (157.5 cm) Weight: 131 lb 9.8 oz (59.7 kg) IBW/kg (Calculated) : 50.1  Vital Signs: Temp: 99.1 F (37.3 C) (11/02 0810) Temp Source: Oral (11/02 0810) BP: 106/63 mmHg (11/02 0810) Pulse Rate: 68 (11/02 0810) Intake/Output from previous day: 11/01 0701 - 11/02 0700 In: 2371 [I.V.:2011; NG/GT:60; IV Piggyback:300] Out: 275 [Emesis/NG output:675] Intake/Output from this shift: Total I/O In: 181.3 [I.V.:181.3] Out: -   Labs:  Recent Labs  2014-07-29 1345 2014-07-29 2000 2014-07-29 2353 03/12/15 0030 03/12/15 0430  WBC 22.5* 14.9*  --  11.9* 14.7*  HGB 12.9 10.0* 9.9* 8.2* 8.7*  PLT 81* 91*  --  81* 86*  CREATININE 3.79*  --  3.30*  --  3.35*   Estimated Creatinine Clearance: 13.2 mL/min (by C-G formula based on Cr of 3.35).   Past Medical History  Diagnosis Date  . Diabetes mellitus without complication (HCC)   . Renal disorder   . Hypertension    Assessment: 63yof admitted with AMS, hypoglycemia and N/V. Subsequently intubated and code stemi called. Went to the cath lab and received 2 stents to her RCA. Another code stemi called last night, however, her stents were found to be patent. She spiked a fever last night to 102.5 and her WBC are trending up. She will begin unasyn for probable aspiration pneumonia. Of note she is ESRD on HD (usual TTS) and was dialyzed yesterday after her first cath.  Goal of Therapy:  Appropriate dosing  Plan:  1) Unasyn 3g VI q24 2) Follow HD plans, LOT  Fredrik RiggerMarkle, [place] Capers Sue 03/12/2015,9:37 AM

## 2015-03-12 NOTE — Progress Notes (Signed)
Admit: 03/22/2015 LOS: 1  40F ESRD admit with STEMI, Severe Hypoglycemia, AMS/Unresponsive  Subjective:  HD yesterday,  No UF Fever overnight, hypotensiive, on pressores and ABX Hypoglycemic on on dextrose gtt Repeat LHC overnight, stent patent  11/01 0701 - 11/02 0700 In: 2371 [I.V.:2011; NG/GT:60; IV Piggyback:300] Out: 275 [Emesis/NG output:675]  Filed Weights   03/18/2015 1820  Weight: 59.7 kg (131 lb 9.8 oz)    Scheduled Meds: . antiseptic oral rinse  7 mL Mouth Rinse QID  . aspirin  324 mg Oral Once  . aspirin EC  81 mg Oral Daily  . atorvastatin  40 mg Oral q1800  . calcium chloride  1 g Intravenous Once  . chlorhexidine gluconate  15 mL Mouth Rinse BID  . heparin      . multivitamin  1 tablet Oral QHS  . pantoprazole (PROTONIX) IV  40 mg Intravenous Q12H  . potassium chloride  10 mEq Intravenous Q1 Hr x 2  . sodium bicarbonate  50 mEq Intravenous Once  . sodium chloride  500 mL Intravenous Once  . sodium chloride  3 mL Intravenous Q12H  . sodium chloride  3 mL Intravenous Q12H  . sodium chloride  3 mL Intravenous Q12H  . sodium chloride  3 mL Intravenous Q12H  . ticagrelor  90 mg Oral BID   Continuous Infusions: . dextrose 5 % and 0.45% NaCl 100 mL/hr at 03/12/15 0813  . norepinephrine (LEVOPHED) Adult infusion 15 mcg/min (03/12/15 0813)   PRN Meds:.sodium chloride, sodium chloride, sodium chloride, sodium chloride, sodium chloride, sodium chloride, acetaminophen, albuterol, alteplase, fentaNYL (SUBLIMAZE) injection, lidocaine (PF), lidocaine-prilocaine, ondansetron (ZOFRAN) IV, pentafluoroprop-tetrafluoroeth, sodium chloride, sodium chloride, sodium chloride, sodium chloride  Current Labs: reviewed    Physical Exam:  Blood pressure 106/63, pulse 68, temperature 99.1 F (37.3 C), temperature source Oral, resp. rate 32, height 5\' 2"  (1.575 m), weight 59.7 kg (131 lb 9.8 oz), SpO2 100 %. Physical Exam Blood pressure 129/61, pulse 67, temperature 92.1 F (33.4  C), temperature source Rectal, resp. rate 20, SpO2 100 %. GEN: intubated, unresponsive, tongue protruding ENT: NCAT, ETT in place EYES: Eyes close, no response to corenal stimulation CV: RRR, no rub PULM: coarse bs b/l ABD: soft, quiet SKIN: no rashes/lesiosn EXT:no LEE  Unit: [facility] Days: [facility] Time: 4h1215min Dialyzer: F180 EDW: 60kg K/Ca: 2/2 Access: RIJ TDC Needle Size: na BFR/DFR: 450/800 UF Proflie: none VDRA: calcitriol 0.205mcg qTx EPO: none recently Heparin: 2000 IU IVB  A/P 1. [facility]. HD 11/1 for hyperkalemia, resolved 2. Cont to follow on daily basis given poor prognosis currenlty 3. No HD today 2. AMS/Unresponsive 1. ? Prolonged hypoglycemia  2. CCM following 3. Head CT w/o acute findings 3. STEMI s/p PCI 03/24/2015 to RCA 4. VDRF: per CCM 5. HTN/Vol: No UF with hypotension right now 6. Anemia: not on ESA right now, monitor 7. MBD: Address when more stable  [name] Carlicia Leavens MD 03/12/2015, 8:30 AM   Recent Labs Lab 03/22/2015 0904  04/01/2015 1137 04/06/2015 1345 03/12/15 0430  NA 134*  < > 129* 136 132*  K >7.5*  < > 6.5* 4.6 2.9*  CL 93*  < > 97* 92* 101  CO2 10*  --   --  22 17*  GLUCOSE 148*  < > 414* 220* 170*  BUN 59*  < > 52* 29* 28*  CREATININE 7.27*  < > 6.30* 3.79* 3.35*  CALCIUM 9.9  --   --  9.7 8.0*  PHOS  --   --   --   --  4.8*  < > = values in this interval not displayed.  Recent Labs Lab 03/18/2015 0904  03/22/2015 2000 03/12/15 0030 03/12/15 0430  WBC 31.8*  < > 14.9* 11.9* 14.7*  NEUTROABS 27.4*  --   --   --   --   HGB 12.0  < > 10.0* 8.2* 8.7*  HCT 37.5  < > 30.9* 25.0* 26.5*  MCV 89.3  < > 86.6 86.8 86.3  PLT 80*  < > 91* 81* 86*  < > = values in this interval not displayed.

## 2015-03-12 NOTE — Progress Notes (Signed)
Subjective:  Patient remains unresponsive intubated on pressors. Had episode of hypotension with new EKG changes subsequently and relook angiography last night noted to have patent stents with excellent TIMI grade 3 distal flow in RCA.  Objective:  Vital Signs in the last 24 hours: Temp:  [92.1 F (33.4 C)-102.5 F (39.2 C)] 99.1 F (37.3 C) (11/02 0810) Pulse Rate:  [33-112] 68 (11/02 0810) Resp:  [12-37] 32 (11/02 0810) BP: (64-172)/(37-102) 106/63 mmHg (11/02 0810) SpO2:  [74 %-100 %] 100 % (11/02 0810) Arterial Line BP: (30-184)/(17-83) 109/44 mmHg (11/02 0800) FiO2 (%):  [50 %-100 %] 50 % (11/02 0810) Weight:  [59.7 kg (131 lb 9.8 oz)] 59.7 kg (131 lb 9.8 oz) (11/01 1820)  Intake/Output from previous day: 11/01 0701 - 11/02 0700 In: 2371 [I.V.:2011; NG/GT:60; IV Piggyback:300] Out: 275 [Emesis/NG output:675] Intake/Output from this shift: Total I/O In: 181.3 [I.V.:181.3] Out: -   Physical Exam: Neck: no adenopathy, no carotid bruit, no JVD and supple, symmetrical, trachea midline Lungs: Clear anterolaterally Heart: regular rate and rhythm, S1, S2 normal and Soft systolic murmur noted Abdomen: soft, non-tender; bowel sounds normal; no masses,  no organomegaly Extremities: extremities normal, atraumatic, no cyanosis or edema and Right groin stable  Lab Results:  Recent Labs  03/12/15 0030 03/12/15 0430  WBC 11.9* 14.7*  HGB 8.2* 8.7*  PLT 81* 86*    Recent Labs  04/05/2015 1345 04/07/2015 2353 03/12/15 0430  NA 136 134* 132*  K 4.6 3.5 2.9*  CL 92* 100* 101  CO2 22  --  17*  GLUCOSE 220* 116* 170*  BUN 29* 25* 28*  CREATININE 3.79* 3.30* 3.35*    Recent Labs  03/25/2015 1345 03/12/15 0106  TROPONINI >65.00* >65.00*   Hepatic Function Panel  Recent Labs  03/21/2015 0904  PROT 6.9  ALBUMIN 2.9*  AST 716*  ALT 320*  ALKPHOS 85  BILITOT 1.7*   No results for input(s): CHOL in the last 72 hours. No results for input(s): PROTIME in the last 72  hours.  Imaging: Imaging results have been reviewed and Ct Head Wo Contrast  03/24/2015  CLINICAL DATA:  64 year old with acute encephalopathy. Patient was found unresponsive earlier today with a blood glucose level of 20. Patient hypertensive and bradycardic upon arrival of EMS. Current history of end-stage renal disease on hemodialysis. EXAM: CT HEAD WITHOUT CONTRAST TECHNIQUE: Contiguous axial images were obtained from the base of the skull through the vertex without intravenous contrast. COMPARISON:  06/09/2005. FINDINGS: Moderate cortical and deep atrophy, progressive since 2007. Moderate changes of small vessel disease of the white matter diffusely, also progressive. No mass lesion. No midline shift. No acute hemorrhage or hematoma. No extra-axial fluid collections. No evidence of acute infarction. No skull fracture or other focal osseous abnormality involving the skull. Visualized paranasal sinuses, bilateral mastoid air cells and bilateral middle ear cavities well-aerated. Severe bilateral carotid siphon and vertebral artery atherosclerosis. IMPRESSION: 1. No acute intracranial abnormality. 2. Moderate generalized atrophy and moderate chronic microvascular ischemic changes of the white matter, progressive since 2007. Electronically Signed   By: Hulan Saas M.D.   On: 03/26/2015 18:28   Dg Chest Port 1 View  03/12/2015  CLINICAL DATA:  Hypoxia EXAM: PORTABLE CHEST 1 VIEW COMPARISON:  March 11, 2015 FINDINGS: Endotracheal tube tip is 3.9 cm above the carina. Nasogastric tube tip and side port are below the diaphragm. Central catheter tip is in the right atrium, stable. No pneumothorax. There is atelectatic change in the left lower lobe  region. Lungs elsewhere clear. Heart is borderline enlarged with pulmonary vascularity within normal limits. No adenopathy. There is atherosclerotic calcification in the aorta. IMPRESSION: Tube and catheter positions as described without pneumothorax. Persistent  left lower lobe atelectasis. No new opacity. No change in cardiac silhouette. Electronically Signed   By: Bretta BangWilliam  Woodruff III M.D.   On: 03/12/2015 07:11   Dg Chest Portable 1 View  03/23/2015  CLINICAL DATA:  Post stemi EXAM: PORTABLE CHEST - 1 VIEW COMPARISON:  09/04/2014 FINDINGS: Patient intubated, endotracheal tube tip approximately 5 cm above carina. The nasogastric tube extends at least as far as the stomach, tip not seen. Tunneled left IJ hemodialysis catheter extends to the proximal right atrium. Stable mild cardiomegaly. Atheromatous aorta. Left infrahilar subsegmental atelectasis or ainfiltrate. Right lung clear. No pneumothorax. No effusion. Visualized skeletal structures are unremarkable. IMPRESSION: 1. Left infrahilar infiltrate or atelectasis. 2. Stable mild cardiomegaly 3. Support hardware placement as above. Electronically Signed   By: Corlis Leak  Hassell M.D.   On: 03/15/2015 09:28    Cardiac Studies:  Assessment/Plan:  Acute inferoposterior wall myocardial infarction status post PCI to RCA status post relook angiogram noted to have patent RCA stents Multivessel CAD Status post hypoglycemic shock Probable hypoglycemic encephalopathy Acute respiratory failure Status post hypokalemia Hypertension Diabetes mellitus End-stage renal disease on hemodialysis Hyperlipidemia Plan Continue present management per CCM DC femoral sheath Prognosis guarded  LOS: 1 day    Rinaldo CloudHarwani, Bro[place] Broady 03/12/2015, 11:47 AM

## 2015-03-12 NOTE — Progress Notes (Signed)
   03/12/15 0021  Adult Ventilator Settings  Vent Type Servo i  Humidity HME  Vent Mode PRVC  Vt Set 400 mL  Set Rate 12 bmp  FiO2 (%) 50 %  I Time 0.9 Sec(s)  PEEP 5 cmH20  Adult Ventilator Measurements  Resp Rate Spontaneous 13 br/min  Resp Rate Total 25 br/min  Exhaled Vt 368 mL  Measured Ve 9.8 mL  Patient transported back to 2H from the Cath Lab without any complications.

## 2015-03-12 NOTE — Progress Notes (Signed)
CRITICAL VALUE ALERT  Critical value received:  Potassium=2.9  Date of notification:  03/12/15  Time of notification:  0530  Critical value read back:yes  Nurse who received alert:  Anselm LisMelvin kuffour  MD notified (1st page):  ccmd replacement protocol  Time of first page:  0530  MD notified (2nd page):  Time of second page:  Responding MD:  ccmd  Time MD responded:  0530

## 2015-03-12 NOTE — Progress Notes (Signed)
PULMONARY / CRITICAL CARE MEDICINE   Name: Yolanda Rush MRN: [id number] DOB: 06/05/1950    ADMISSION DATE:  March 23, 2015 CONSULTATION DATE:  2015/03/23  REFERRING MD :  Dr. [name]   CHIEF COMPLAINT:  AMS, STEMI, N/V, Hyperkalemia   INITIAL PRESENTATION: 64 y/o F with PMH of DM, CKD and HTN who presented to [facility] on 11/1 after being found by family altered and confused.  EMS found the patient to have hypoglycemia (CBG 20), bradycardia, and hypotension.  She was intubated in the ER.  EKG concerning for STEMI and patient taken to the cath lab.    STUDIES:  11/01  LHC >> stent x2 to RCA 11/1 rpt cath (hypotension)  - stents patent     SIGNIFICANT EVENTS: 11/01  Admit with AMS, hypoglycemia, bradycardia, hypotension.  STEMI >> LHC s/p stent x2 to RCA.     HISTORY OF PRESENT ILLNESS:  64 y/o F with PMH of DM, CKD [facility], T/Th/S) and HTN who presented to [facility] on 11/1 after being found by family altered and confused.    The family reported the patient had nausea / vomiting the evening prior to admit.  She also complained of chest pain.  On 11/1 am they found her altered / unable to wake her and activated EMS.  Evaluation found the patient to have hypoglycemia (CBG 20), bradycardia, and hypotension.  Her husband reports he draws up her insulin and administers.  She received her usual dose the evening prior to admit.  Glucose at that time was 204.  She was intubated in the ER.  ER notes reflect she was unresponsive.  EKG concerning for STEMI and patient taken to the cath lab. Initial labs:  Na 128, K 7.4, cl 98, sr cr 6.40 and glucose 96, hgb 11.9, hct 35.  CXR was negative for acute process.  Post cath lab, the patient was returned to ICU on mechanical ventilation.  PCCM consulted for evaluation / medical management.    Family reports she previously had a AV fistula that she had multiple problems with requiring frequent declotting.  The patient has refused new placement of AVF and has wanted to  continue with her perm cath.  They also indicate she eats / drinks what she wants despite family attempting to limit to a healthy diet.    SUBJECTIVE: remains unresponsive Hypoglycemic overnight , started on D5 gtt Rpt cath for hypotension Dialysed  VITAL SIGNS: Temp:  [92.1 F (33.4 C)-102.5 F (39.2 C)] 100.6 F (38.1 C) (11/02 0300) Pulse Rate:  [30-127] 78 (11/02 0754) Resp:  [12-37] 32 (11/02 0754) BP: (64-180)/(37-113) 137/62 mmHg (11/01 2359) SpO2:  [74 %-100 %] 100 % (11/02 0754) Arterial Line BP: (30-184)/(17-83) 100/41 mmHg (11/02 0754) FiO2 (%):  [50 %-100 %] 50 % (11/02 0334) Weight:  [59.7 kg (131 lb 9.8 oz)] 59.7 kg (131 lb 9.8 oz) (11/01 1820)   HEMODYNAMICS:     VENTILATOR SETTINGS: Vent Mode:  [-] PRVC FiO2 (%):  [50 %-100 %] 50 % Set Rate:  [12 bmp-18 bmp] 12 bmp Vt Set:  [400 mL] 400 mL PEEP:  [5 cmH20] 5 cmH20 Plateau Pressure:  [18 cmH20] 18 cmH20   INTAKE / OUTPUT:  Intake/Output Summary (Last 24 hours) at 03/12/15 4098 Last data filed at 03/12/15 0700  Gross per 24 hour  Intake   2371 ml  Output    275 ml  Net   2096 ml    PHYSICAL EXAMINATION: General:  Thin adult female in NAD Neuro:  Obtunded on vent, no corneal response, no response to verbal / painful stimuli  HEENT:  OETT, mm pink/moist, no jvd Cardiovascular:  s1s2 rrr, soft SEM  Lungs:  resp's even/non-labored, lungs bilaterally clear  Abdomen:  Non-distended, soft Musculoskeletal:  No acute deformities  Skin: cool/dry, no edema   LABS:  CBC  Recent Labs Lab 01-Apr-2015 2000 03/12/15 0030 03/12/15 0430  WBC 14.9* 11.9* 14.7*  HGB 10.0* 8.2* 8.7*  HCT 30.9* 25.0* 26.5*  PLT 91* 81* 86*   Coag's No results for input(s): APTT, INR in the last 168 hours. BMET  Recent Labs Lab 04-01-15 0904  April 01, 2015 1137 2015-04-01 1345 03/12/15 0430  NA 134*  < > 129* 136 132*  K >7.5*  < > 6.5* 4.6 2.9*  CL 93*  < > 97* 92* 101  CO2 10*  --   --  22 17*  BUN 59*  < > 52* 29* 28*   CREATININE 7.27*  < > 6.30* 3.79* 3.35*  GLUCOSE 148*  < > 414* 220* 170*  < > = values in this interval not displayed. Electrolytes  Recent Labs Lab 01-Apr-2015 0904 01-Apr-2015 1345 03/12/15 0430  CALCIUM 9.9 9.7 8.0*  MG  --   --  1.7  PHOS  --   --  4.8*   Sepsis Markers No results for input(s): LATICACIDVEN, PROCALCITON, O2SATVEN in the last 168 hours. ABG  Recent Labs Lab 01-Apr-2015 0945 2015-04-01 1118 04-01-15 1628  PHART 7.213* 7.221* 7.592*  PCO2ART 26.8* 35.1 20.1*  PO2ART 569.0* 608.0* 346*   Liver Enzymes  Recent Labs Lab 2015/04/01 0904  AST 716*  ALT 320*  ALKPHOS 85  BILITOT 1.7*  ALBUMIN 2.9*   Cardiac Enzymes  Recent Labs Lab April 01, 2015 1345 03/12/15 0106  TROPONINI >65.00* >65.00*   Glucose  Recent Labs Lab April 01, 2015 1954 04/01/2015 2020 Apr 01, 2015 2144 2015-04-01 2209 03/12/15 0300 03/12/15 0603  GLUCAP 28* 116* 74 74 168* 144*    Imaging Ct Head Wo Contrast  04/01/15  CLINICAL DATA:  64 year old with acute encephalopathy. Patient was found unresponsive earlier today with a blood glucose level of 20. Patient hypertensive and bradycardic upon arrival of EMS. Current history of end-stage renal disease on hemodialysis. EXAM: CT HEAD WITHOUT CONTRAST TECHNIQUE: Contiguous axial images were obtained from the base of the skull through the vertex without intravenous contrast. COMPARISON:  06/09/2005. FINDINGS: Moderate cortical and deep atrophy, progressive since 2007. Moderate changes of small vessel disease of the white matter diffusely, also progressive. No mass lesion. No midline shift. No acute hemorrhage or hematoma. No extra-axial fluid collections. No evidence of acute infarction. No skull fracture or other focal osseous abnormality involving the skull. Visualized paranasal sinuses, bilateral mastoid air cells and bilateral middle ear cavities well-aerated. Severe bilateral carotid siphon and vertebral artery atherosclerosis. IMPRESSION: 1. No acute  intracranial abnormality. 2. Moderate generalized atrophy and moderate chronic microvascular ischemic changes of the white matter, progressive since 2007. Electronically Signed   By: Hulan Saas M.D.   On: 01-Apr-2015 18:28   Dg Chest Port 1 View  03/12/2015  CLINICAL DATA:  Hypoxia EXAM: PORTABLE CHEST 1 VIEW COMPARISON:  04/01/2015 FINDINGS: Endotracheal tube tip is 3.9 cm above the carina. Nasogastric tube tip and side port are below the diaphragm. Central catheter tip is in the right atrium, stable. No pneumothorax. There is atelectatic change in the left lower lobe region. Lungs elsewhere clear. Heart is borderline enlarged with pulmonary vascularity within normal limits. No adenopathy. There is  atherosclerotic calcification in the aorta. IMPRESSION: Tube and catheter positions as described without pneumothorax. Persistent left lower lobe atelectasis. No new opacity. No change in cardiac silhouette. Electronically Signed   By: Bretta BangWilliam  Woodruff III M.D.   On: 03/12/2015 07:11   Dg Chest Portable 1 View  04/07/2015  CLINICAL DATA:  Post stemi EXAM: PORTABLE CHEST - 1 VIEW COMPARISON:  09/04/2014 FINDINGS: Patient intubated, endotracheal tube tip approximately 5 cm above carina. The nasogastric tube extends at least as far as the stomach, tip not seen. Tunneled left IJ hemodialysis catheter extends to the proximal right atrium. Stable mild cardiomegaly. Atheromatous aorta. Left infrahilar subsegmental atelectasis or ainfiltrate. Right lung clear. No pneumothorax. No effusion. Visualized skeletal structures are unremarkable. IMPRESSION: 1. Left infrahilar infiltrate or atelectasis. 2. Stable mild cardiomegaly 3. Support hardware placement as above. Electronically Signed   By: Corlis Leak  Hassell M.D.   On: 2014/07/02 09:28     ASSESSMENT / PLAN:  PULMONARY OETT 11/1 >>  A: Acute Respiratory Failure - in setting of STEMI, hypoglycemia, hypotension  At Risk Aspiration - nausea / vomiting, L  infrahilar atx on admit CXR   P:   PRVC 8 cc/kg  PRN albuterol    CARDIOVASCULAR CVL A:  STEMI - s/p stent x2 to RCA  Cardiogenic shock, doubt sepsis HTN P:  Anti-platelets per Cardiology  Dr. Sharyn LullHarwani following  ASA , Lipitor Await echo  RENAL [facility] Permcath HD >>  A:   [facility] Hyponatremia  Hyperkalemia P:   Nephrology consult Trend BMP  Replace electrolytes as indicated -  will repeat K  GASTROINTESTINAL A:   Nausea / Vomiting  Coffee Ground Emesis - UGIB  P:   NPO  Dc protonix infusion & start bid   HEMATOLOGIC A:   Mild Anemia  P:  Trend CBC  DVT Prophylaxis: SCD's / post cath 11/1  Transfuse per ICU guidelines- goal 8 & above  INFECTIOUS A:   At Risk of Aspiration - given hx of n/v  P:   Monitor fever curve / WBC  Check resp cx Start unasyn 11/2 >>  ENDOCRINE A:   Hyperglycemia  DM   P:   Allow CBGs to run high D51/2 NS  NEUROLOGIC A:   Acute Encephalopathy - in setting of hypoglycemia, bradycardia, hypotension  Head CT 11/1 >> increased atrophy compared to 2007 P:   RASS goal: 0 PRN fentanyl only  Serial neuro exams    FAMILY  - Updates: son  / daughter in law updated at bedside 11/2  - Inter-disciplinary family meet or Palliative Care meeting due by:  11/8   Summary - Hypoglycemic encephalopathy, STEMi with cardiogenic shock Guarded prognosis given poor neuro exam  The patient is critically ill with multiple organ systems failure and requires high complexity decision making for assessment and support, frequent evaluation and titration of therapies, application of advanced monitoring technologies and extensive interpretation of multiple databases. Critical Care Time devoted to patient care services described in this note independent of APP time is 35 minutes.   Cyril Mourningakesh Loi Rennaker MD. Tonny BollmanFCCP. [facility] Pager [phone] If no response call [phone]   03/12/2015, 8:08 AM

## 2015-03-12 NOTE — Progress Notes (Signed)
Right groin arterial sheath pulled at 12:20 PM, pressure held until 12:35, with no complications. Groin site is a level zero and vital signs stable.

## 2015-03-12 NOTE — Progress Notes (Signed)
[facility]OC[facility] FOR AM LAB REPLACEMENT ONLY  The patient does not apply for the [facility] Electrolyte Replacment Protocol based on the criteria listed below:    Is GFR >/= 40 ml/min? No.  Patient's GFR today is 13   Abnormal electrolyte(s):K2.9  If a panic level lab has been reported, has the CCM MD in charge been notified? Yes.  .   Physician:  E Deterding,MD  Yolanda NakayamaChisholm, Yolanda Rush 03/12/2015 5:40 AM

## 2015-03-12 NOTE — Progress Notes (Signed)
Echocardiogram 2D Echocardiogram with Definity has been performed.  Nolon RodBrown, Tony 03/12/2015, 4:53 PM

## 2015-03-12 NOTE — Progress Notes (Signed)
CDS notified referral number issued. Referral number [id number]. Spoke to [employer]-Col[employer]Mike Shade. CDS will continue to follow.

## 2015-03-13 LAB — GLUCOSE, CAPILLARY
GLUCOSE-CAPILLARY: 186 mg/dL — AB (ref 65–99)
GLUCOSE-CAPILLARY: 74 mg/dL (ref 65–99)
Glucose-Capillary: 94 mg/dL (ref 65–99)

## 2015-03-14 ENCOUNTER — Telehealth: Payer: Self-pay

## 2015-03-14 NOTE — Telephone Encounter (Signed)
On 03/14/2015 I received a death certificate [facility] [place]. The death certificate is for cremation. The patient is a patient of Doctor Vassie Lolllva. [facility] Monday am for signature. On 03/14/2015 I received a call from [facility] [place] and the lady told me to shred the death certificate.

## 2015-03-15 LAB — CULTURE, RESPIRATORY W GRAM STAIN

## 2015-03-15 LAB — CULTURE, RESPIRATORY

## 2015-03-17 ENCOUNTER — Ambulatory Visit: Payer: Medicare Other | Admitting: Podiatry

## 2015-03-17 ENCOUNTER — Ambulatory Visit (HOSPITAL_COMMUNITY): Admission: RE | Admit: 2015-03-17 | Payer: Medicare Other | Source: Ambulatory Visit | Admitting: Vascular Surgery

## 2015-03-17 ENCOUNTER — Encounter (HOSPITAL_COMMUNITY): Admission: RE | Payer: Self-pay | Source: Ambulatory Visit

## 2015-03-17 SURGERY — UPPER EXTREMITY VENOGRAPHY
Anesthesia: LOCAL

## 2015-03-31 ENCOUNTER — Ambulatory Visit: Payer: Medicare Other | Admitting: Internal Medicine

## 2015-04-10 NOTE — Progress Notes (Signed)
Admit: March 28, 2015 LOS: 2  14F ESRD admit with STEMI, Severe Hypoglycemia, AMS/Unresponsive  Subjective:  Worsening neurologically, no cough  On pressors, low BPs  11/02 0701 - 11/03 0700 In: 4059.5 [I.V.:3494.5; NG/GT:215; IV Piggyback:350] Out: 0   Filed Weights   2015/03/28 1820  Weight: 59.7 kg (131 lb 9.8 oz)    Scheduled Meds: . ampicillin-sulbactam (UNASYN) IV  3 g Intravenous Q24H  . antiseptic oral rinse  7 mL Mouth Rinse QID  . aspirin  324 mg Oral Once  . aspirin  81 mg Oral Daily  . atorvastatin  40 mg Oral q1800  . calcium chloride  1 g Intravenous Once  . chlorhexidine gluconate  15 mL Mouth Rinse BID  . multivitamin  1 tablet Oral QHS  . pantoprazole (PROTONIX) IV  40 mg Intravenous Q12H  . sodium bicarbonate  50 mEq Intravenous Once  . sodium chloride  500 mL Intravenous Once  . sodium chloride  3 mL Intravenous Q12H  . sodium chloride  3 mL Intravenous Q12H  . sodium chloride  3 mL Intravenous Q12H  . sodium chloride  3 mL Intravenous Q12H  . ticagrelor  90 mg Oral BID   Continuous Infusions: . dextrose 5 % and 0.45% NaCl 100 mL/hr at 03/21/2015 0631  . norepinephrine (LEVOPHED) Adult infusion 14 mcg/min (03/26/2015 0455)   PRN Meds:.sodium chloride, sodium chloride, sodium chloride, sodium chloride, sodium chloride, sodium chloride, acetaminophen, albuterol, alteplase, fentaNYL (SUBLIMAZE) injection, lidocaine (PF), lidocaine-prilocaine, ondansetron (ZOFRAN) IV, pentafluoroprop-tetrafluoroeth, sodium chloride, sodium chloride, sodium chloride, sodium chloride  Current Labs: reviewed    Physical Exam:  Blood pressure 88/52, pulse 64, temperature 98.9 F (37.2 C), temperature source Oral, resp. rate 25, height  (1.575 m), weight 59.7 kg (131 lb 9.8 oz), SpO2 63 %. Physical Exam Blood pressure 129/61, pulse 67, temperature 92.1 F (33.4 C), temperature source Rectal, resp. rate 20, SpO2 100 %. GEN: intubated, unresponsive, tongue protruding ENT: NCAT,  ETT in place EYES: Eyes close, no response to corenal stimulation CV: RRR, no rub PULM: coarse bs b/l ABD: soft, quiet SKIN: no rashes/lesiosn EXT:no LEE  Unit: [facility] Days: THS Time: 4h19min Dialyzer: F180 EDW: 60kg K/Ca: 2/2 Access: RIJ TDC Needle Size: na BFR/DFR: 450/800 UF Proflie: none VDRA: calcitriol 0.75mcg qTx EPO: none recently Heparin: 2000 IU IVB  A/P 1. ESRD on iHD THS at [facility] 1. HD 11/1 for hyperkalemia, resolved 2. Cont to follow on daily basis given poor prognosis currenlty 3. Follow labs today 4. Outlook is very poor, HD unlikely be of any benefit, await futher conversation with family 2. AMS/Unresponsive 1. ? Prolonged hypoglycemia  2. CCM following 3. Head CT w/o acute findings 4. Not improving in past 72h 3. STEMI s/p PCI 28-Mar-2015 to RCA 4. VDRF: per CCM 5. HTN/Vol: No UF with hypotension right now 6. 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  Recent Labs Lab 03/30/2015 0904  03/30/2015 2000 04/07/2015 2353 03/12/15 0030 03/12/15 0430  WBC 31.8*  < > 14.9*  --  11.9* 14.7*  NEUTROABS 27.4*  --   --   --   --   --   HGB 12.0  < > 10.0* 9.9* 8.2* 8.7*  HCT 37.5  < > 30.9* 29.0* 25.0* 26.5*  MCV 89.3  < > 86.6  --  86.8 86.3  PLT 80*  < > 91*  --  81* 86*  < > = values in this interval not displayed.

## 2015-04-10 NOTE — Discharge Summary (Addendum)
PULMONARY / CRITICAL CARE MEDICINE   Name: Yolanda Rush MRN: [id number] DOB: Sep 22, 1950    ADMISSION DATE:  04/02/2015 CONSULTATION DATE:  03/31/2015  REFERRING MD :  Dr. Sharyn LullHarwani   CHIEF COMPLAINT:  AMS, STEMI, N/V, Hyperkalemia   INITIAL PRESENTATION: 64 y/o F with PMH of DM, CKD and HTN who [facility] after being found by family altered and confused.  EMS found the patient to have hypoglycemia (CBG 20), bradycardia, and hypotension.  She was intubated in the ER.  EKG concerning for STEMI and patient taken to the cath lab.    STUDIES:  11/01  LHC >> stent x2 to RCA 11/1 rpt cath (hypotension)  - stents patent     SIGNIFICANT EVENTS: 11/01  Admit with AMS, hypoglycemia, bradycardia, hypotension.  STEMI >> LHC s/p stent x2 to RCA.      ASSESSMENT / PLAN:  PULMONARY OETT 11/1 >>  A: Acute Respiratory Failure - in setting of STEMI, hypoglycemia, hypotension  At Risk Aspiration - nausea / vomiting, L infrahilar atx on admit CXR   P:   PRVC 8 cc/kg  PRN albuterol    CARDIOVASCULAR A:  STEMI - s/p stent x2 to RCA  Cardiogenic shock, doubt sepsis HTN Anemia of chronic disease P:  Anti-platelets per Cardiology  Dr. Sharyn LullHarwani following  ASA , Lipitor   RENAL [facility] Permcath HD >>  A:   [facility] Hyponatremia  Hyperkalemia P:   Nephrology consult Trend BMP  Replace electrolytes as indicated -  will repeat K  GASTROINTESTINAL A:   Coffee Ground Emesis - UGIB  Shock liver P:   NPO   protonix infusion &bid    INFECTIOUS A:   At Risk of Aspiration - given hx of n/v  P:   Monitor fever curve / WBC  Check resp cx Start unasyn 11/2 >>  ENDOCRINE A:   Hyperglycemia  DM   P:   Allow CBGs to run high D51/2 NS  NEUROLOGIC A:   Acute Encephalopathy - in setting of hypoglycemia, bradycardia, hypotension  Head CT 11/1 >> increased atrophy compared to 2007 P:   RASS goal: 0 PRN fentanyl only  Serial neuro exams    FAMILY  -  Updates: son  / daughter in law updated at bedside 11/2  - Inter-disciplinary family meet or Palliative Care meeting due by:  11/8   Course - Hypoglycemic encephalopathy, STEMi with cardiogenic shock. DNR issued after discussion with family She passed away 11/3 am  Cause of death- acute encephalopathy, end-stage renal disease, diabetes type 2, myocardial infarction  [facility] Institute[facility],RAKESH V. MD  03/19/2015, 1:12 PM

## 2015-04-10 DEATH — deceased

## 2016-04-23 IMAGING — CT CT HEAD W/O CM
2 series · 15 of 30 positions shown, 17 images · non-contrast
Comparison: 06/09/2005.

CLINICAL DATA: 63-year-old with acute encephalopathy. Patient was
found unresponsive earlier today with a blood glucose level of 20.
Patient hypertensive and bradycardic upon arrival of EMS. Current
history of end-stage renal disease on hemodialysis.

EXAM:
CT HEAD WITHOUT CONTRAST
TECHNIQUE: Contiguous axial images were obtained from the base of the skull
through the vertex without intravenous contrast.

[Series 2: head without · axial · non-contrast · 0.44mm/px · z∈[+1342,+1462]mm · 7 of 32 slices shown, 9 images]
[im 4/32  brain]
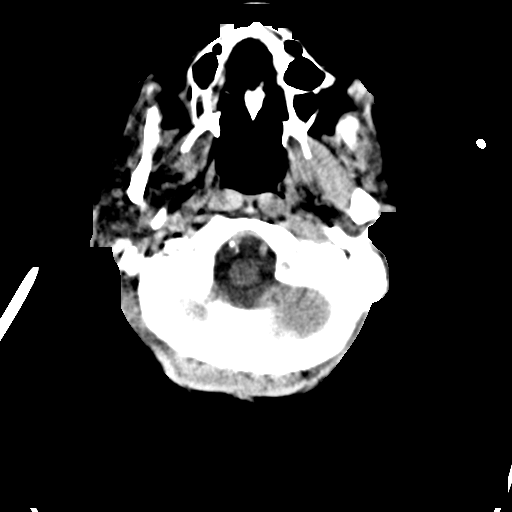
[im 4/32  bone]
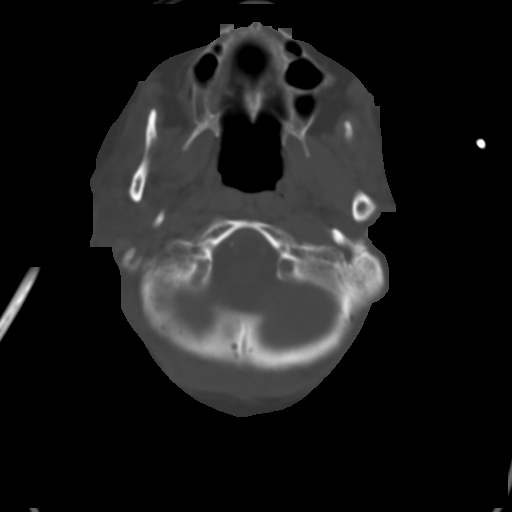
[im 8/32  brain]
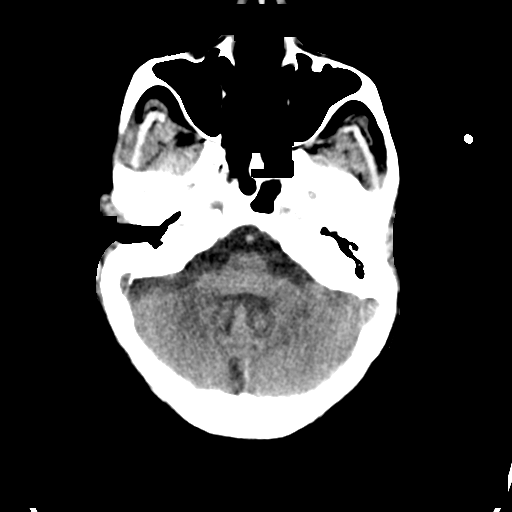
[im 12/32  brain]
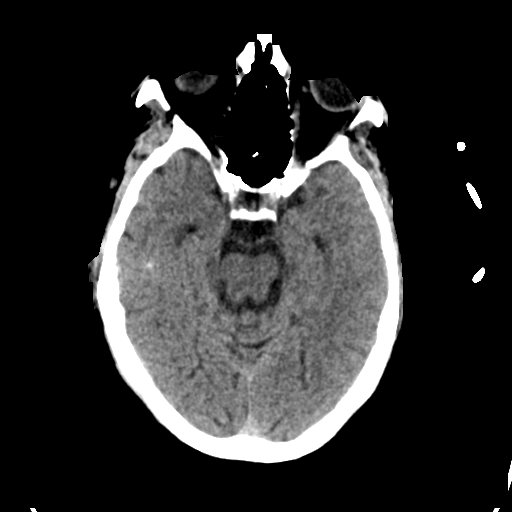
[im 16/32  brain]
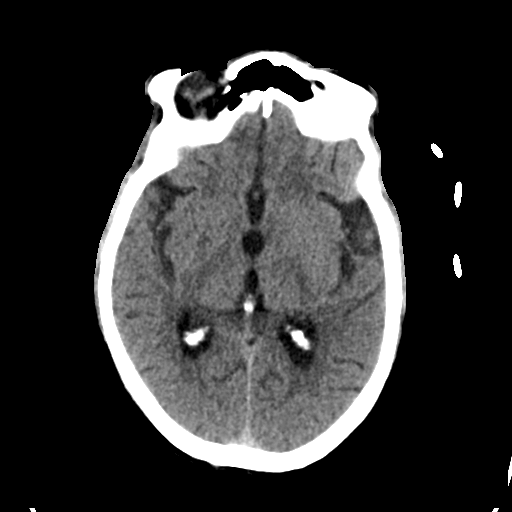
[im 20/32  brain]
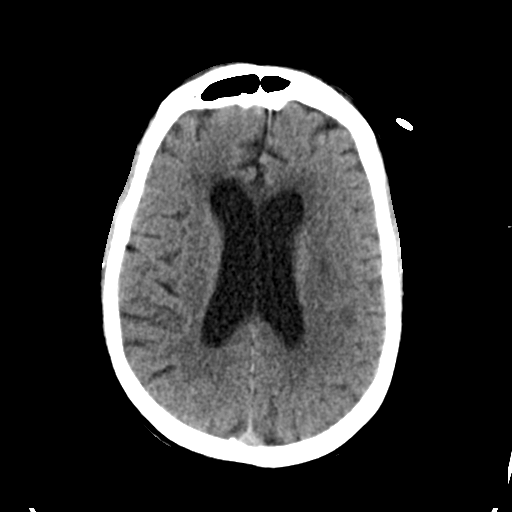
[im 20/32  bone]
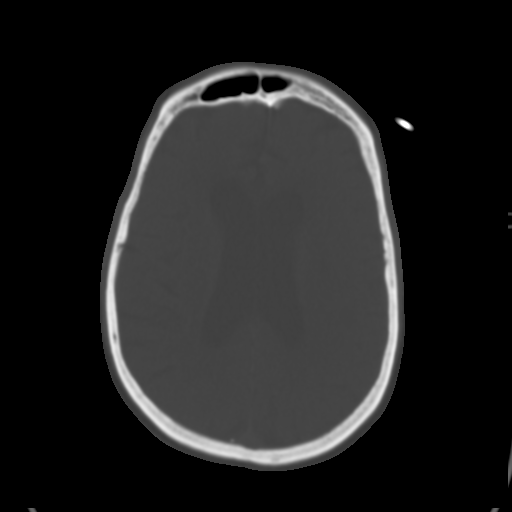
[im 24/32  brain]
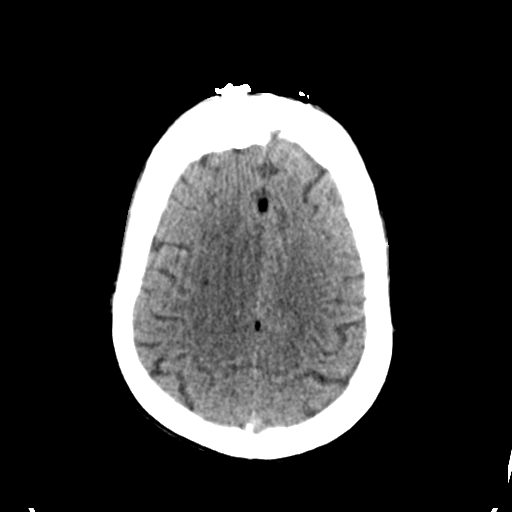
[im 28/32  brain]
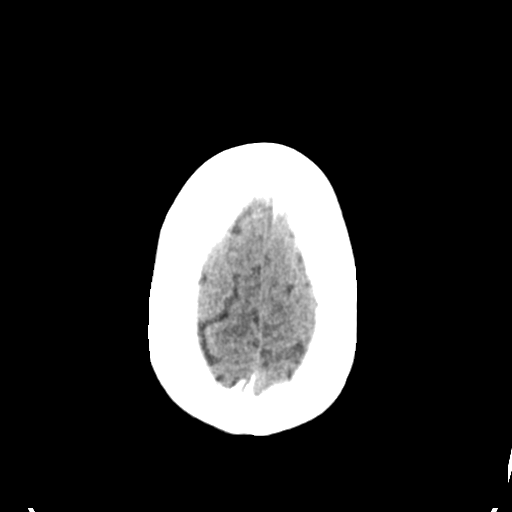

[Series 3: head bone · axial · 0.44mm/px · z∈[+1341,+1467]mm · 8 of 79 slices shown]
[im 8/79  bone]
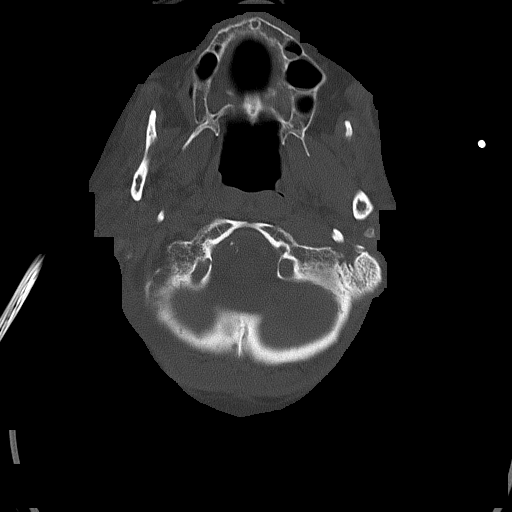
[im 16/79  bone]
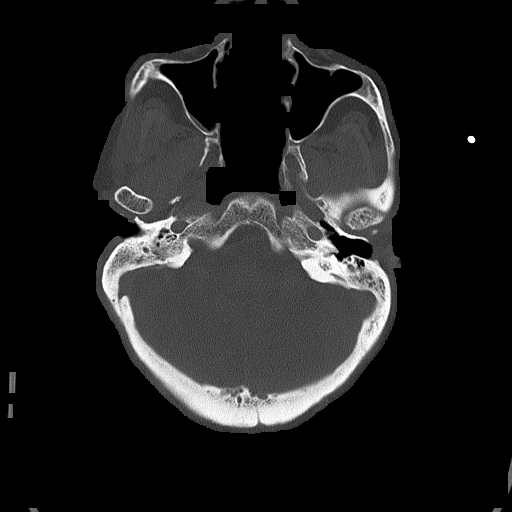
[im 24/79  bone]
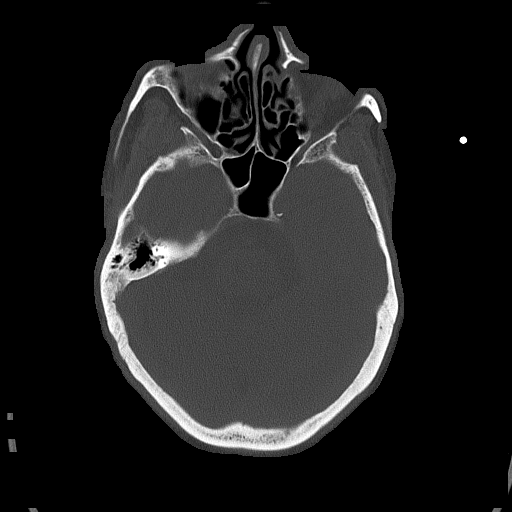
[im 36/79  bone]
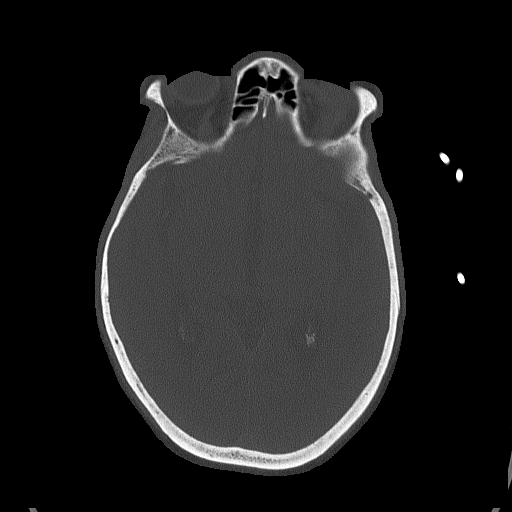
[im 43/79  bone]
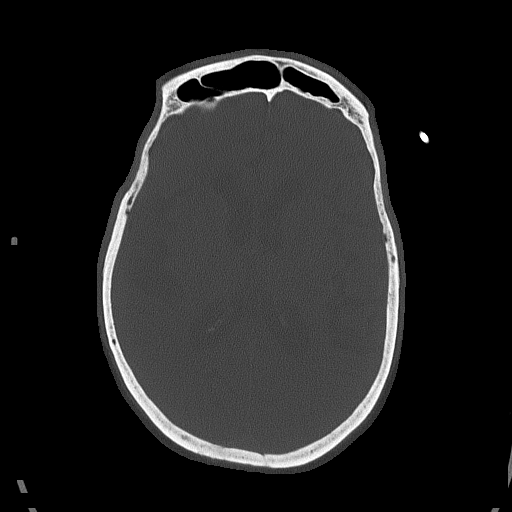
[im 55/79  bone]
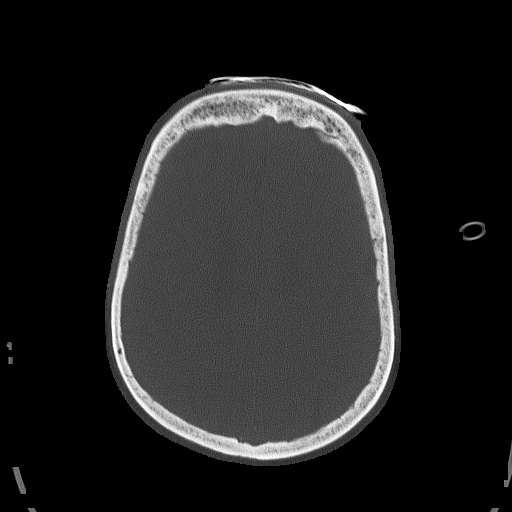
[im 63/79  bone]
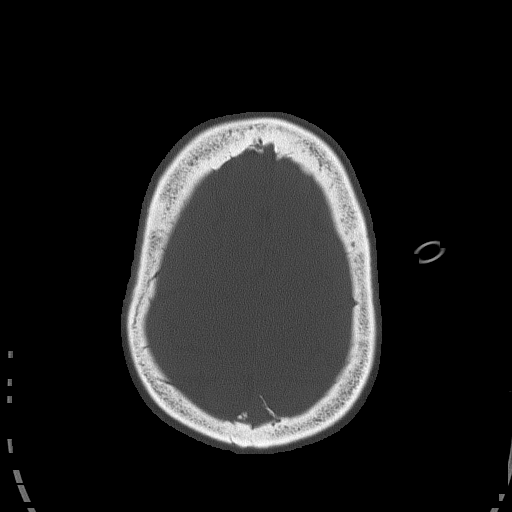
[im 71/79  bone]
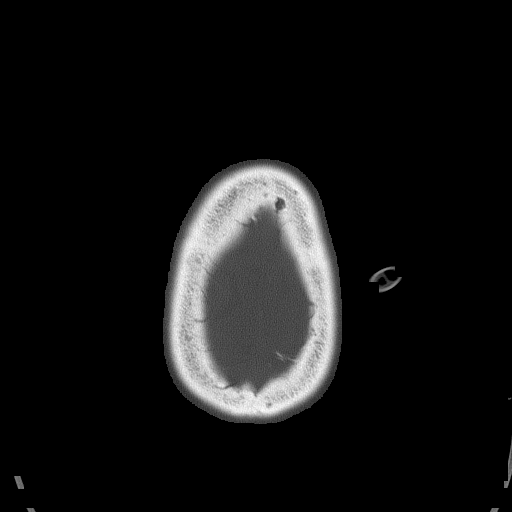

[15 of 30 positions shown; findings below may reference images not displayed]

FINDINGS: Moderate cortical and deep atrophy, progressive since 1557. Moderate
changes of small vessel disease of the white matter diffusely, also
progressive. No mass lesion. No midline shift. No acute hemorrhage
or hematoma. No extra-axial fluid collections. No evidence of acute
infarction.

No skull fracture or other focal osseous abnormality involving the
skull. Visualized paranasal sinuses, bilateral mastoid air cells and
bilateral middle ear cavities well-aerated. Severe bilateral carotid
siphon and vertebral artery atherosclerosis.
IMPRESSION: 1. No acute intracranial abnormality.
2. Moderate generalized atrophy and moderate chronic microvascular
ischemic changes of the white matter, progressive since [DATE].

## 2016-04-24 IMAGING — CR DG CHEST 1V PORT
1 series · 1 of 1 positions shown · non-contrast
Comparison: March 11, 2015

CLINICAL DATA: Hypoxia

EXAM:
PORTABLE CHEST 1 VIEW

[AP]
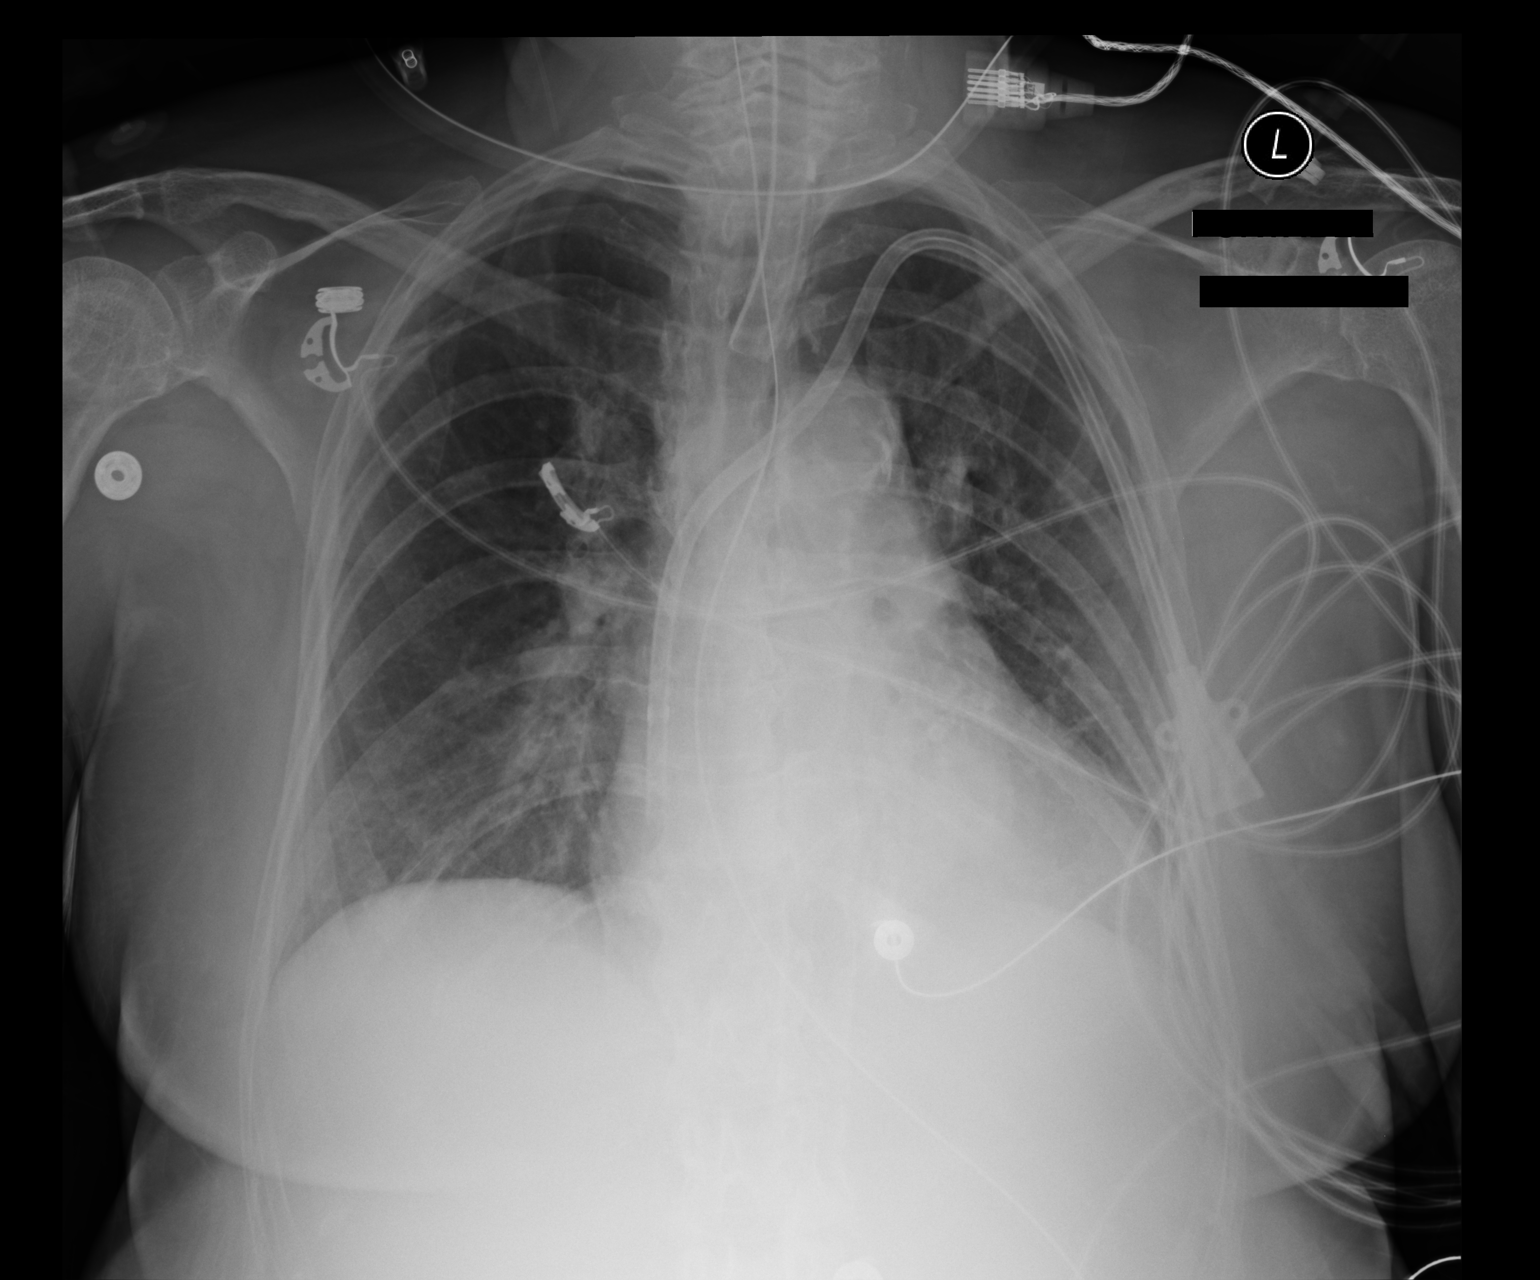

[1 of 1 positions shown; findings below may reference images not displayed]

FINDINGS: Endotracheal tube tip is 3.9 cm above the carina. Nasogastric tube
tip and side port are below the diaphragm. Central catheter tip is
in the right atrium, stable. No pneumothorax. There is atelectatic
change in the left lower lobe region. Lungs elsewhere clear. Heart
is borderline enlarged with pulmonary vascularity within normal
limits. No adenopathy. There is atherosclerotic calcification in the
aorta.
IMPRESSION: Tube and catheter positions as described without pneumothorax.
Persistent left lower lobe atelectasis. No new opacity. No change in
cardiac silhouette.
